# Patient Record
Sex: Male | Born: 1935 | Race: White | Hispanic: No | Marital: Married | Smoking: Former smoker
Health system: Southern US, Community
[De-identification: ages and names within clinical notes are randomized; demographics above are authoritative.]

## PROBLEM LIST (undated history)

## (undated) DIAGNOSIS — E7800 Pure hypercholesterolemia, unspecified: Secondary | ICD-10-CM

## (undated) DIAGNOSIS — K219 Gastro-esophageal reflux disease without esophagitis: Secondary | ICD-10-CM

## (undated) DIAGNOSIS — I10 Essential (primary) hypertension: Secondary | ICD-10-CM

## (undated) DIAGNOSIS — I4891 Unspecified atrial fibrillation: Secondary | ICD-10-CM

## (undated) DIAGNOSIS — Q681 Congenital deformity of finger(s) and hand: Secondary | ICD-10-CM

## (undated) HISTORY — PX: CARDIAC CATHETERIZATION: SHX172

## (undated) HISTORY — PX: HIP ARTHROSCOPY: SHX668

## (undated) HISTORY — PX: CORONARY ANGIOPLASTY WITH STENT PLACEMENT: SHX49

---

## 2012-11-14 ENCOUNTER — Inpatient Hospital Stay: Admit: 2012-11-14 | Payer: Self-pay | Admitting: General Surgery

## 2012-11-14 ENCOUNTER — Encounter (HOSPITAL_COMMUNITY): Payer: Self-pay | Admitting: Anesthesiology

## 2012-11-14 ENCOUNTER — Emergency Department (HOSPITAL_COMMUNITY): Payer: Medicare Other | Admitting: Anesthesiology

## 2012-11-14 ENCOUNTER — Emergency Department (HOSPITAL_COMMUNITY): Payer: Medicare Other

## 2012-11-14 ENCOUNTER — Encounter (HOSPITAL_COMMUNITY): Payer: Self-pay | Admitting: *Deleted

## 2012-11-14 ENCOUNTER — Encounter (HOSPITAL_COMMUNITY)
Admission: EM | Disposition: A | Discharge status: Home / Self Care | Payer: Self-pay | Source: Home / Self Care | Attending: Emergency Medicine

## 2012-11-14 ENCOUNTER — Emergency Department (HOSPITAL_COMMUNITY)
Admission: EM | Admit: 2012-11-14 | Discharge: 2012-11-14 | Disposition: A | Discharge status: Home / Self Care | Payer: Medicare Other | Attending: Emergency Medicine | Admitting: Emergency Medicine

## 2012-11-14 DIAGNOSIS — S66909A Unspecified injury of unspecified muscle, fascia and tendon at wrist and hand level, unspecified hand, initial encounter: Secondary | ICD-10-CM | POA: Insufficient documentation

## 2012-11-14 DIAGNOSIS — W298XXA Contact with other powered powered hand tools and household machinery, initial encounter: Secondary | ICD-10-CM | POA: Insufficient documentation

## 2012-11-14 DIAGNOSIS — I10 Essential (primary) hypertension: Secondary | ICD-10-CM | POA: Insufficient documentation

## 2012-11-14 DIAGNOSIS — S61509A Unspecified open wound of unspecified wrist, initial encounter: Secondary | ICD-10-CM | POA: Insufficient documentation

## 2012-11-14 DIAGNOSIS — E7800 Pure hypercholesterolemia, unspecified: Secondary | ICD-10-CM | POA: Insufficient documentation

## 2012-11-14 DIAGNOSIS — S61511A Laceration without foreign body of right wrist, initial encounter: Secondary | ICD-10-CM

## 2012-11-14 DIAGNOSIS — S5410XA Injury of median nerve at forearm level, unspecified arm, initial encounter: Secondary | ICD-10-CM | POA: Insufficient documentation

## 2012-11-14 HISTORY — DX: Congenital deformity of finger(s) and hand: Q68.1

## 2012-11-14 HISTORY — DX: Pure hypercholesterolemia, unspecified: E78.00

## 2012-11-14 HISTORY — PX: I & D EXTREMITY: SHX5045

## 2012-11-14 HISTORY — DX: Gastro-esophageal reflux disease without esophagitis: K21.9

## 2012-11-14 HISTORY — DX: Essential (primary) hypertension: I10

## 2012-11-14 LAB — BASIC METABOLIC PANEL
BUN: 12 mg/dL (ref 6–23)
Creatinine, Ser: 1.11 mg/dL (ref 0.50–1.35)
GFR calc Af Amer: 71 mL/min — ABNORMAL LOW (ref >90)
GFR calc non Af Amer: 62 mL/min — ABNORMAL LOW (ref >90)

## 2012-11-14 LAB — CBC WITH DIFFERENTIAL/PLATELET
Basophils Absolute: 0.1 10*3/uL (ref 0.0–0.1)
Basophils Relative: 1 % (ref 0–1)
Eosinophils Absolute: 0.6 10*3/uL (ref 0.0–0.7)
Eosinophils Relative: 7 % — ABNORMAL HIGH (ref 0–5)
MCH: 31.4 pg (ref 26.0–34.0)
MCHC: 34.3 g/dL (ref 30.0–36.0)
MCV: 91.4 fL (ref 78.0–100.0)
Platelets: 255 10*3/uL (ref 150–400)
RDW: 13.1 % (ref 11.5–15.5)
WBC: 8.3 10*3/uL (ref 4.0–10.5)

## 2012-11-14 LAB — PROTIME-INR: Prothrombin Time: 18.5 seconds — ABNORMAL HIGH (ref 11.6–15.2)

## 2012-11-14 SURGERY — IRRIGATION AND DEBRIDEMENT EXTREMITY
Anesthesia: General | Site: Arm Lower | Laterality: Right | Wound class: Contaminated

## 2012-11-14 MED ORDER — TETANUS-DIPHTH-ACELL PERTUSSIS 5-2.5-18.5 LF-MCG/0.5 IM SUSP
0.5000 mL | Freq: Once | INTRAMUSCULAR | Status: AC
  Administered 2012-11-14: 0.5 mL via INTRAMUSCULAR
  Filled 2012-11-14 (×2): qty 0.5

## 2012-11-14 MED ORDER — LIDOCAINE-EPINEPHRINE (PF) 2 %-1:200000 IJ SOLN
INTRAMUSCULAR | Status: AC
  Administered 2012-11-14: 20 mL
  Filled 2012-11-14: qty 20

## 2012-11-14 MED ORDER — ONDANSETRON HCL 4 MG/2ML IJ SOLN: 4.0000 mg | Freq: Once | INTRAMUSCULAR | Status: DC | PRN

## 2012-11-14 MED ORDER — PHENYLEPHRINE HCL 10 MG/ML IJ SOLN
INTRAMUSCULAR | Status: DC | PRN
  Administered 2012-11-14: 80 ug via INTRAVENOUS

## 2012-11-14 MED ORDER — BUPIVACAINE HCL (PF) 0.25 % IJ SOLN
INTRAMUSCULAR | Status: DC | PRN
  Administered 2012-11-14: 10 mL

## 2012-11-14 MED ORDER — CEFAZOLIN SODIUM-DEXTROSE 2-3 GM-% IV SOLR
INTRAVENOUS | Status: DC | PRN
  Administered 2012-11-14: 2 g via INTRAVENOUS

## 2012-11-14 MED ORDER — LIDOCAINE-EPINEPHRINE 2 %-1:100000 IJ SOLN
20.0000 mL | Freq: Once | INTRAMUSCULAR | Status: DC
Start: 2012-11-14 — End: 2012-11-15

## 2012-11-14 MED ORDER — PROPOFOL 10 MG/ML IV BOLUS
INTRAVENOUS | Status: DC | PRN
  Administered 2012-11-14: 30 mg via INTRAVENOUS
  Administered 2012-11-14: 150 mg via INTRAVENOUS

## 2012-11-14 MED ORDER — HYDROMORPHONE HCL PF 1 MG/ML IJ SOLN
0.2500 mg | INTRAMUSCULAR | Status: DC | PRN
  Administered 2012-11-14 (×2): 0.5 mg via INTRAVENOUS

## 2012-11-14 MED ORDER — HYDROMORPHONE HCL PF 1 MG/ML IJ SOLN
INTRAMUSCULAR | Status: AC
  Filled 2012-11-14: qty 1

## 2012-11-14 MED ORDER — LIDOCAINE HCL (CARDIAC) 20 MG/ML IV SOLN
INTRAVENOUS | Status: DC | PRN
  Administered 2012-11-14: 100 mg via INTRAVENOUS

## 2012-11-14 MED ORDER — ONDANSETRON HCL 4 MG/2ML IJ SOLN
INTRAMUSCULAR | Status: DC | PRN
  Administered 2012-11-14: 4 mg via INTRAVENOUS

## 2012-11-14 MED ORDER — DEXAMETHASONE SODIUM PHOSPHATE 4 MG/ML IJ SOLN
INTRAMUSCULAR | Status: DC | PRN
  Administered 2012-11-14: 4 mg via INTRAVENOUS

## 2012-11-14 MED ORDER — MIDAZOLAM HCL 5 MG/5ML IJ SOLN
INTRAMUSCULAR | Status: DC | PRN
  Administered 2012-11-14: 2 mg via INTRAVENOUS

## 2012-11-14 MED ORDER — SUFENTANIL CITRATE 50 MCG/ML IV SOLN
INTRAVENOUS | Status: DC | PRN
  Administered 2012-11-14 (×2): 5 ug via INTRAVENOUS

## 2012-11-14 MED ORDER — SODIUM CHLORIDE 0.9 % IV BOLUS (SEPSIS)
1000.0000 mL | Freq: Once | INTRAVENOUS | Status: AC
  Administered 2012-11-14: 1000 mL via INTRAVENOUS

## 2012-11-14 MED ORDER — LACTATED RINGERS IV SOLN
INTRAVENOUS | Status: DC | PRN
  Administered 2012-11-14: 20:00:00 via INTRAVENOUS

## 2012-11-14 MED ORDER — HYDROMORPHONE HCL PF 1 MG/ML IJ SOLN
0.5000 mg | Freq: Once | INTRAMUSCULAR | Status: AC
  Administered 2012-11-14: 0.5 mg via INTRAVENOUS
  Filled 2012-11-14: qty 1

## 2012-11-14 MED ORDER — SODIUM CHLORIDE 0.9 % IR SOLN
Status: DC | PRN
  Administered 2012-11-14: 1

## 2012-11-14 MED ORDER — SODIUM CHLORIDE 0.9 % IV SOLN
INTRAVENOUS | Status: DC | PRN
  Administered 2012-11-14: 19:00:00 via INTRAVENOUS

## 2012-11-14 SURGICAL SUPPLY — 48 items
BAG DECANTER FOR FLEXI CONT (MISCELLANEOUS) IMPLANT
BANDAGE ELASTIC 3 VELCRO ST LF (GAUZE/BANDAGES/DRESSINGS) ×2 IMPLANT
BANDAGE ELASTIC 4 VELCRO ST LF (GAUZE/BANDAGES/DRESSINGS) IMPLANT
BANDAGE GAUZE ELAST BULKY 4 IN (GAUZE/BANDAGES/DRESSINGS) ×2 IMPLANT
BNDG ELASTIC 2 VLCR STRL LF (GAUZE/BANDAGES/DRESSINGS) IMPLANT
CLOTH BEACON ORANGE TIMEOUT ST (SAFETY) ×2 IMPLANT
CORDS BIPOLAR (ELECTRODE) IMPLANT
CUFF TOURNIQUET SINGLE 18IN (TOURNIQUET CUFF) IMPLANT
DRAPE SURG 17X23 STRL (DRAPES) ×2 IMPLANT
ELECT REM PT RETURN 9FT ADLT (ELECTROSURGICAL)
ELECTRODE REM PT RTRN 9FT ADLT (ELECTROSURGICAL) IMPLANT
GAUZE PACKING IODOFORM 1/4X5 (PACKING) IMPLANT
GAUZE XEROFORM 1X8 LF (GAUZE/BANDAGES/DRESSINGS) ×2 IMPLANT
GLOVE BIO SURGEON STRL SZ8 (GLOVE) ×2 IMPLANT
GLOVE BIOGEL PI IND STRL 8 (GLOVE) ×1 IMPLANT
GLOVE BIOGEL PI INDICATOR 8 (GLOVE) ×1
GLOVE ORTHO TXT STRL SZ7.5 (GLOVE) ×2 IMPLANT
GOWN STRL NON-REIN LRG LVL3 (GOWN DISPOSABLE) ×6 IMPLANT
HANDPIECE INTERPULSE COAX TIP (DISPOSABLE)
KIT BASIN OR (CUSTOM PROCEDURE TRAY) ×2 IMPLANT
KIT ROOM TURNOVER OR (KITS) ×2 IMPLANT
MANIFOLD NEPTUNE II (INSTRUMENTS) ×2 IMPLANT
NEEDLE HYPO 25GX1X1/2 BEV (NEEDLE) IMPLANT
NS IRRIG 1000ML POUR BTL (IV SOLUTION) ×2 IMPLANT
NeuraGen Nerve Guide (Nerve Graft) ×2 IMPLANT
PACK ORTHO EXTREMITY (CUSTOM PROCEDURE TRAY) ×2 IMPLANT
PAD ARMBOARD 7.5X6 YLW CONV (MISCELLANEOUS) ×4 IMPLANT
PAD CAST 4YDX4 CTTN HI CHSV (CAST SUPPLIES) ×1 IMPLANT
PADDING CAST COTTON 4X4 STRL (CAST SUPPLIES) ×1
SET HNDPC FAN SPRY TIP SCT (DISPOSABLE) IMPLANT
SOAP 2 % CHG 4 OZ (WOUND CARE) ×2 IMPLANT
SPLINT FIBERGLASS 3X35 (CAST SUPPLIES) ×2 IMPLANT
SPONGE GAUZE 4X4 12PLY (GAUZE/BANDAGES/DRESSINGS) ×2 IMPLANT
SPONGE LAP 18X18 X RAY DECT (DISPOSABLE) IMPLANT
SPONGE LAP 4X18 X RAY DECT (DISPOSABLE) ×2 IMPLANT
SUT FIBERWIRE 4-0 18 DIAM BLUE (SUTURE) ×2
SUT PROLENE 4 0 PS 2 18 (SUTURE) ×4 IMPLANT
SUT PROLENE 5 0 RB 1 DA (SUTURE) ×2 IMPLANT
SUT VIC AB 3-0 SH 27 (SUTURE) ×1
SUT VIC AB 3-0 SH 27X BRD (SUTURE) ×1 IMPLANT
SUTURE FIBERWR 4-0 18 DIA BLUE (SUTURE) ×1 IMPLANT
SYR CONTROL 10ML LL (SYRINGE) IMPLANT
TOWEL OR 17X24 6PK STRL BLUE (TOWEL DISPOSABLE) ×2 IMPLANT
TOWEL OR 17X26 10 PK STRL BLUE (TOWEL DISPOSABLE) ×2 IMPLANT
TUBE ANAEROBIC SPECIMEN COL (MISCELLANEOUS) IMPLANT
TUBE CONNECTING 12X1/4 (SUCTIONS) ×2 IMPLANT
WATER STERILE IRR 1000ML POUR (IV SOLUTION) ×2 IMPLANT
YANKAUER SUCT BULB TIP NO VENT (SUCTIONS) ×2 IMPLANT

## 2012-11-14 NOTE — Op Note (Signed)
NAME:  DONYELL, DING NO.:  [id number]  MEDICAL RECORD NO.:  [id number]  LOCATION:  [facility]                         FACILITY:  [facility]  PHYSICIAN:  Johnette Abraham, MD    DATE OF BIRTH:  10/18/1932  DATE OF PROCEDURE:  11/14/2012 DATE OF DISCHARGE:                              OPERATIVE REPORT   PREOPERATIVE DIAGNOSIS:  Complex laceration, a chainsaw injury to the right wrist with presumed flexor tendon and nerve laceration.  POSTOPERATIVE DIAGNOSIS:  Complex laceration, a chainsaw injury to the right wrist with presumed flexor tendon and nerve laceration.  PROCEDURE:  Exploration of complex wound of the right wrist, evacuation of hematoma, limited fasciotomy of the volar wrist, repair of the FCR tendon and the FCU tendon.  Repair of the median nerve with a 5 mm inside diameter nerve conduit.  Complex closure of laceration totaling 9 cm.  ANESTHESIA:  General.  INDICATIONS:  Mr. Veldhuizen is a 78 year old male, who was working with a chain saw when the chain saw kicked back on him, sustaining a laceration to his right wrist.  He presented to an outside hospital.  I was consulted and referred him here urgently.  On evaluation, he had decreased sensation in the palm.  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  Risks, benefits, and alternatives of surgery were thoroughly discussed with him.  He agreed with these and agreed to proceed with surgery.  Consent was obtained.  PROCEDURE:  The patient was taken to the operating room, placed supine on the operating room table.  Time-out was performed.  General anesthesia was administered without difficulty.  The right upper extremity was prepped and draped in normal sterile fashion.  The arm was exsanguinated.  Tourniquet was inflated to 250 mmHg.  The old temporary stitches from the  outside facility were removed.  There was a gaping wound all the way down to the bone.  There was quite a bit of hematoma that was evacuated.  It was obvious that there were multiple tendon injuries.  Fortunately the radial artery and ulnar artery were intact. The ulnar nerve was isolated and intact.  The FCU tendon was approximately 50% lacerated.  This was repaired with 4-0 FiberWire.  The flexor apparatus to the patient's conjoined ulnar fingers was intact. It was just debrided a little bit, but did not require suture.  There were complete lacerations to some deeper flexor tendons in the wrist, however, when grasping the distal aspect, they gently moved the knob finger that he had and therefore this finger was nonfunctional and therefore they were not repaired.  On the radial wrist, there was complete laceration of the FCR tendon.  There was laceration of the palmaris longus tendon.  The radial artery was intact.  There was complete laceration of the median nerve.  There was laceration of some musculature deep.  The radius was intact.  After thorough irrigation and evacuation of the hematoma, the FCR tendon was brought in proximity.  A epitendinous repair was  performed with a running 5-0 Prolene.  Core strand sutures were repaired with modified Kessler 4-0 FiberWire with a good result.  The palmaris longus tendon was not repaired, was resected gently proximally and distally.  The edges of the median nerve were found after a limited fasciotomy was performed.  Both ends were brought in approximation.  The epineurium was approximated with good wrist flexion with two 6-0 Prolene sutures.  Following a 5 mm inside diameter, nerve conduit was used and wrapped around the nerve repair and sutured in place.  Afterwards the tourniquet was released.  Hemostasis was obtained with direct pressure and cautery.  The muscle fascia was partially closed.  The subcutaneous layer was closed with interrupted  3- 0 Vicryl and the skin was closed with 4-0 Prolene.  A sterile dressing and extension block splint were placed.  The patient tolerated the procedure well and was taken to recovery room in stable condition.     Johnette Abraham, MD     HCC/MEDQ  D:  11/14/2012  T:  11/14/2012  Job:  [id number]

## 2012-11-14 NOTE — ED Notes (Signed)
Cut right wrist with chain saw PTA.  Bleeding actively in triage.

## 2012-11-14 NOTE — Anesthesia Postprocedure Evaluation (Signed)
  Anesthesia Post-op Note  Patient: Anthony Fields  Procedure(s) Performed: Procedure(s) (LRB) with comments: IRRIGATION AND DEBRIDEMENT EXTREMITY (Right)  Patient Location: PACU  Anesthesia Type:General  Level of Consciousness: awake, oriented, sedated and patient cooperative  Airway and Oxygen Therapy: Patient Spontanous Breathing  Post-op Pain: none  Post-op Assessment: Post-op Vital signs reviewed, Patient's Cardiovascular Status Stable, Respiratory Function Stable, Patent Airway, No signs of Nausea or vomiting and Pain level controlled  Post-op Vital Signs: stable  Complications: No apparent anesthesia complications

## 2012-11-14 NOTE — Transfer of Care (Signed)
Immediate Anesthesia Transfer of Care Note  Patient: Anthony Fields  Procedure(s) Performed: Procedure(s) (LRB) with comments: IRRIGATION AND DEBRIDEMENT EXTREMITY (Right)  Patient Location: PACU  Anesthesia Type:General  Level of Consciousness: awake, alert , oriented and patient cooperative  Airway & Oxygen Therapy: Patient Spontanous Breathing and Patient connected to nasal cannula oxygen  Post-op Assessment: Report given to PACU RN, Post -op Vital signs reviewed and stable and Patient moving all extremities X 4  Post vital signs: Reviewed and stable  Complications: No apparent anesthesia complications

## 2012-11-14 NOTE — H&P (Signed)
Reason for Consult:laceration Referring Physician: ER - [facility]  Anthony Fields is an 78 y.o. left handed male.  CC: I cut my wrist HPI: pt wass using chain saw and saw kicked and lacerated wrist, c/o bleeding, numbness to thumb; presented to outside ER, active bleeding controlled with sutures, tight wrap. Currently pain 4/10, dull, throbbing, isolated to R wrist.   Past Medical History  Diagnosis Date  . Hypertension   . High cholesterol   . Acid reflux   . Hand deformity, congenital     right hand -fingers  CAD H/O Mini Stroke  Past Surgical History  Procedure Date  . Cardiac catheterization   . Coronary angioplasty with stent placement     No family history on file.  Social History:  does not have a smoking history on file. He does not have any smokeless tobacco history on file. His alcohol and drug histories not on file. Quit smoking years ago, occasional beer Allergies: No Known Allergies  Medications: I have reviewed the patient's current medications.  Results for orders placed during the hospital encounter of 11/14/12 (from the past 48 hour(s))  CBC WITH DIFFERENTIAL     Status: Abnormal   Collection Time   11/14/12  4:00 PM      Component Value Range Comment   WBC 8.3  4.0 - 10.5 K/uL    RBC 4.43  4.22 - 5.81 MIL/uL    Hemoglobin 13.9  13.0 - 17.0 g/dL    HCT 16.1  09.6 - 04.5 %    MCV 91.4  78.0 - 100.0 fL    MCH 31.4  26.0 - 34.0 pg    MCHC 34.3  30.0 - 36.0 g/dL    RDW 40.9  81.1 - 91.4 %    Platelets 255  150 - 400 K/uL    Neutrophils Relative 46  43 - 77 %    Neutro Abs 3.8  1.7 - 7.7 K/uL    Lymphocytes Relative 36  12 - 46 %    Lymphs Abs 3.0  0.7 - 4.0 K/uL    Monocytes Relative 10  3 - 12 %    Monocytes Absolute 0.8  0.1 - 1.0 K/uL    Eosinophils Relative 7 (*) 0 - 5 %    Eosinophils Absolute 0.6  0.0 - 0.7 K/uL    Basophils Relative 1  0 - 1 %    Basophils Absolute 0.1  0.0 - 0.1 K/uL   PROTIME-INR     Status: Abnormal   Collection Time   11/14/12   4:00 PM      Component Value Range Comment   Prothrombin Time 18.5 (*) 11.6 - 15.2 seconds    INR 1.59 (*) 0.00 - 1.49   BASIC METABOLIC PANEL     Status: Abnormal   Collection Time   11/14/12  4:00 PM      Component Value Range Comment   Sodium 141  135 - 145 mEq/L    Potassium 3.6  3.5 - 5.1 mEq/L    Chloride 106  96 - 112 mEq/L    CO2 25  19 - 32 mEq/L    Glucose, Bld 146 (*) 70 - 99 mg/dL    BUN 12  6 - 23 mg/dL    Creatinine, Ser 7.82  0.50 - 1.35 mg/dL    Calcium 9.7  8.4 - 95.6 mg/dL    GFR calc non Af Amer 62 (*) >90 mL/min    GFR calc Af Amer 71 (*) >  90 mL/min   TYPE AND SCREEN     Status: Normal   Collection Time   11/14/12  4:00 PM      Component Value Range Comment   ABO/RH(D) A POS      Antibody Screen NEG      Sample Expiration 11/17/2012       Dg Wrist 2 Views Right  11/14/2012  *RADIOLOGY REPORT*  Clinical Data: Chainsaw laceration  RIGHT WRIST - 2 VIEW  Comparison: None.  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  Mild degenerative change at the base of the thumb.  No acute fracture.  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  Soft tissue swelling ventral wrist region.  IMPRESSION: Negative for acute fracture.   Original Report Authenticated By: Janeece Riggers, M.D.    Dg Chest Portable 1 View  11/14/2012  *RADIOLOGY REPORT*  Clinical Data: Extremity laceration  PORTABLE CHEST - 1 VIEW  Comparison: None  Findings: Heart size and vascularity are normal.  Lungs are clear without infiltrate or effusion.  Negative for pneumonia. Mild apical scarring.  IMPRESSION: No acute cardiopulmonary abnormality.   Original Report Authenticated By: Janeece Riggers, M.D.     Pertinent items are noted in HPI.  No recent sob, cp, s/s of stroke Temp:  [97.8 F (36.6 C)-98.5 F (36.9 C)] 98.5 F (36.9 C) (12/21 1734) Pulse Rate:  [76-114] 76  (12/21 1734) Resp:  [16-20] 16  (12/21 1734) BP: (134-140)/(64-66) 134/66 mmHg (12/21  1734) SpO2:  [95 %-96 %] 95 % (12/21 1734) General appearance: alert and cooperative Resp: clear to auscultation bilaterally Cardio: regular rate and rhythm GI: soft, non-tender; bowel sounds normal; no masses,  no organomegaly Extremities: extremities normal, atraumatic, no cyanosis or edema and except for R wrist with loosely sutured laceration entire anterior wrist; no active bleeding; decreased sensation to thumb and palm of hand, ulnar side of hand appears wnl, congenital deformity of hand (claw) with good strength of thumb, good flexion of ulnar digits, cap refill intact    Assessment/Plan: Chain saw injury to R wrist - ? Tendon, nerve injury Plan: will explore and repair - explained procedure to patient in detail including risks of surgery.  Anthony Fields CHRISTOPHER 11/14/2012, 6:28 PM

## 2012-11-14 NOTE — Anesthesia Preprocedure Evaluation (Signed)
Anesthesia Evaluation  Patient identified by MRN, date of birth, ID band Patient awake    Reviewed: Allergy & Precautions, H&P , NPO status , Patient's Chart, lab work & pertinent test results  Airway Mallampati: I TM Distance: >3 FB Neck ROM: full    Dental   Pulmonary          Cardiovascular hypertension, + CAD and + Cardiac Stents Rhythm:regular Rate:Normal     Neuro/Psych    GI/Hepatic GERD-  ,  Endo/Other    Renal/GU      Musculoskeletal   Abdominal   Peds  Hematology   Anesthesia Other Findings   Reproductive/Obstetrics                           Anesthesia Physical Anesthesia Plan  ASA: III  Anesthesia Plan: General   Post-op Pain Management:    Induction: Intravenous  Airway Management Planned: LMA and Oral ETT  Additional Equipment:   Intra-op Plan:   Post-operative Plan: Extubation in OR  Informed Consent:   Plan Discussed with:   Anesthesia Plan Comments:         Anesthesia Quick Evaluation

## 2012-11-14 NOTE — Discharge Instructions (Signed)
Stop taking your Aspirin and Plavix until Tuesday, Dec 24th. Then you may resume taking.  Discharge Instructions:  Keep your dressing clean, dry and in place until instructed to remove by Dr. Izora Ribas.  If the dressing becomes dirty or wet call the office for instructions during business hours. Elevate the extremity to help with swelling, this will also help with any discomfort. Take your medication as prescribed. No lifting with the injured  extremity. If you feel that the dressing is too tight, you may loosen it, but keep it on; finger tips should be pink; if there is a concern, call the office. [phone] Ice may be used if the injury is a fracture, do not apply ice directly to the skin. Please call the office on the next business day after discharge to arrange a follow up appointment.  Call [phone] between the hours of 9am - 5pm M-Th or 9am - 1pm on Fri. For most hand injuries and/or conditions, you may return to work using the uninjured hand (one handed duty) within 24-72 hours.  A detailed note will be provided to you at your follow up appointment or may contact the office prior to your follow up.   Laceration Care, Adult A laceration is a cut or lesion that goes through all layers of the skin and into the tissue just beneath the skin. TREATMENT  Some lacerations may not require closure. Some lacerations may not be able to be closed due to an increased risk of infection. It is important to see your caregiver as soon as possible after an injury to minimize the risk of infection and maximize the opportunity for successful closure. If closure is appropriate, pain medicines may be given, if needed. The wound will be cleaned to help prevent infection. Your caregiver will use stitches (sutures), staples, wound glue (adhesive), or skin adhesive strips to repair the laceration. These tools bring the skin edges together to allow for faster healing and a better cosmetic outcome. However, all  wounds will heal with a scar. Once the wound has healed, scarring can be minimized by covering the wound with sunscreen during the day for 1 full year. HOME CARE INSTRUCTIONS  For sutures or staples:  Keep the wound clean and dry.  If you were given a bandage (dressing), you should change it at least once a day. Also, change the dressing if it becomes wet or dirty, or as directed by your caregiver.  Wash the wound with soap and water 2 times a day. Rinse the wound off with water to remove all soap. Pat the wound dry with a clean towel.  After cleaning, apply a thin layer of the antibiotic ointment as recommended by your caregiver. This will help prevent infection and keep the dressing from sticking.  You may shower as usual after the first 24 hours. Do not soak the wound in water until the sutures are removed.  Only take over-the-counter or prescription medicines for pain, discomfort, or fever as directed by your caregiver.  Get your sutures or staples removed as directed by your caregiver. For skin adhesive strips:  Keep the wound clean and dry.  Do not get the skin adhesive strips wet. You may bathe carefully, using caution to keep the wound dry.  If the wound gets wet, pat it dry with a clean towel.  Skin adhesive strips will fall off on their own. You may trim the strips as the wound heals. Do not remove skin adhesive strips that are still  stuck to the wound. They will fall off in time. For wound adhesive:  You may briefly wet your wound in the shower or bath. Do not soak or scrub the wound. Do not swim. Avoid periods of heavy perspiration until the skin adhesive has fallen off on its own. After showering or bathing, gently pat the wound dry with a clean towel.  Do not apply liquid medicine, cream medicine, or ointment medicine to your wound while the skin adhesive is in place. This may loosen the film before your wound is healed.  If a dressing is placed over the wound, be  careful not to apply tape directly over the skin adhesive. This may cause the adhesive to be pulled off before the wound is healed.  Avoid prolonged exposure to sunlight or tanning lamps while the skin adhesive is in place. Exposure to ultraviolet light in the first year will darken the scar.  The skin adhesive will usually remain in place for 5 to 10 days, then naturally fall off the skin. Do not pick at the adhesive film. You may need a tetanus shot if:  You cannot remember when you had your last tetanus shot.  You have never had a tetanus shot. If you get a tetanus shot, your arm may swell, get red, and feel warm to the touch. This is common and not a problem. If you need a tetanus shot and you choose not to have one, there is a rare chance of getting tetanus. Sickness from tetanus can be serious. SEEK MEDICAL CARE IF:   You have redness, swelling, or increasing pain in the wound.  You see a red line that goes away from the wound.  You have yellowish-white fluid (pus) coming from the wound.  You have a fever.  You notice a bad smell coming from the wound or dressing.  Your wound breaks open before or after sutures have been removed.  You notice something coming out of the wound such as wood or glass.  Your wound is on your hand or foot and you cannot move a finger or toe. SEEK IMMEDIATE MEDICAL CARE IF:   Your pain is not controlled with prescribed medicine.  You have severe swelling around the wound causing pain and numbness or a change in color in your arm, hand, leg, or foot.  Your wound splits open and starts bleeding.  You have worsening numbness, weakness, or loss of function of any joint around or beyond the wound.  You develop painful lumps near the wound or on the skin anywhere on your body. MAKE SURE YOU:   Understand these instructions.  Will watch your condition.  Will get help right away if you are not doing well or get worse. Document Released:  11/11/2005 Document Revised: 02/03/2012 Document Reviewed: 05/07/2011 [facility] Patient Information 2013 [place], [place].

## 2012-11-14 NOTE — ED Provider Notes (Signed)
History   This chart was scribed for Glynn Octave, MD by Leone Payor, ED Scribe. This patient was seen in room APA02/APA02 and the patient's care was started at 1519.   CSN: [id number]  Arrival date & time 11/14/12  1519   None     Chief Complaint  Patient presents with  . Extremity Laceration     The history is provided by the patient. No language interpreter was used.    Anthony Fields is a 78 y.o. male who presents to the Emergency Department complaining of a new, severe laceration to the right wrist from a chainsaw accident PTA. Pt states he has some numbness to the fingers but is able to move them. Pt states he takes aspirin daily. The bleeding is active.     Dr. Jerrol Banana is heart doctor.  Pt has h/o HTN, high cholesterol, acid reflux, cardiac catheterization.  History reviewed. No pertinent past medical history.  No past surgical history on file.  No family history on file.  History  Substance Use Topics  . Smoking status: Not on file  . Smokeless tobacco: Not on file  . Alcohol Use: Not on file      Review of Systems  A complete 10 system review of systems was obtained and all systems are negative except as noted in the HPI and PMH.   Allergies  Review of patient's allergies indicates not on file.  Home Medications  No current outpatient prescriptions on file.  There were no vitals taken for this visit.  Physical Exam  Nursing note and vitals reviewed. Constitutional: He appears well-developed and well-nourished.  HENT:  Head: Normocephalic and atraumatic.  Eyes: Conjunctivae normal are normal. Pupils are equal, round, and reactive to light.  Neck: Neck supple. No tracheal deviation present. No thyromegaly present.  Cardiovascular: Normal rate and regular rhythm.   No murmur heard.      2+ radial pulse. No arterial bleeding  Pulmonary/Chest: Effort normal and breath sounds normal.  Abdominal: Soft. Bowel sounds are normal. He exhibits no distension.  There is no tenderness.  Musculoskeletal: Normal range of motion. He exhibits no edema and no tenderness.       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 5cm gaping laceration to the palmer aspect of the right wrist. There is active oozing to the medial and lateral aspect of the wound.   Neurological: He is alert. Coordination normal.  Skin: Skin is warm and dry. No rash noted.  Psychiatric: He has a normal mood and affect.    ED Course  LACERATION REPAIR Date/Time: 11/14/2012 4:00 PM Performed by: Glynn Octave Authorized by: Glynn Octave Consent: Verbal consent obtained. The procedure was performed in an emergent situation. Risks and benefits: risks, benefits and alternatives were discussed Consent given by: patient Patient identity confirmed: verbally with patient and arm band Time out: Immediately prior to procedure a "time out" was called to verify the correct patient, procedure, equipment, support staff and site/side marked as required. Body area: upper extremity Location details: right wrist Laceration length: 6 cm Contamination: The wound is contaminated. Tendon involvement: complex Nerve involvement: complex Vascular damage: yes Anesthesia: local infiltration Local anesthetic: lidocaine 2% with epinephrine Anesthetic total: 10 ml Patient sedated: no Preparation: Patient was prepped and draped in the usual sterile fashion. Irrigation solution: saline Irrigation method: syringe Amount of cleaning: extensive Debridement: none Degree of undermining: none Skin closure: 3-0 nylon Technique: simple Approximation: close Approximation difficulty: complex Patient tolerance: Patient tolerated the procedure well  with no immediate complications. Comments: Temporary hemostasis of oozing blood vessels   (including critical care time)  DIAGNOSTIC STUDIES: Oxygen Saturation is 95% on room air, adequate by my interpretation.    COORDINATION OF  CARE:  3:34 PM Discussed treatment plan which includes imaging  with pt at bedside and pt agreed to plan.    Labs Reviewed  CBC WITH DIFFERENTIAL - Abnormal; Notable for the following:    Eosinophils Relative 7 (*)     All other components within normal limits  PROTIME-INR - Abnormal; Notable for the following:    Prothrombin Time 18.5 (*)     INR 1.59 (*)     All other components within normal limits  BASIC METABOLIC PANEL - Abnormal; Notable for the following:    Glucose, Bld 146 (*)     GFR calc non Af Amer 62 (*)     GFR calc Af Amer 71 (*)     All other components within normal limits  TYPE AND SCREEN   Dg Wrist 2 Views Right  11/14/2012  *RADIOLOGY REPORT*  Clinical Data: Chainsaw laceration  RIGHT WRIST - 2 VIEW  Comparison: None.  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  Mild degenerative change at the base of the thumb.  No acute fracture.  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  Soft tissue swelling ventral wrist region.  IMPRESSION: Negative for acute fracture.   Original Report Authenticated By: Janeece Riggers, M.D.    Dg Chest Portable 1 View  11/14/2012  *RADIOLOGY REPORT*  Clinical Data: Extremity laceration  PORTABLE CHEST - 1 VIEW  Comparison: None  Findings: Heart size and vascularity are normal.  Lungs are clear without infiltrate or effusion.  Negative for pneumonia. Mild apical scarring.  IMPRESSION: No acute cardiopulmonary abnormality.   Original Report Authenticated By: Janeece Riggers, M.D.      No diagnosis found.    MDM  Laceration to right volar wrist with chainsaw. Active bleeding with flexor tendons visible. 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  Sensation is subjectively decreased.  Large sutures placed in skin temporarily for hemostasis. Bleeding controlled with pressure dressing and temporary sutures.  Discussed with hand surgery Dr. Izora Ribas who will meet patient at  [facility] ED.  Dr. Ignacia Palma accepts patient for [place]. Vitals remain stable and bleeding controlled at time of transfer.   Date: 11/14/2012  Rate: 73  Rhythm: normal sinus rhythm  QRS Axis: normal  Intervals: normal  ST/T Wave abnormalities: normal  Conduction Disutrbances:none  Narrative Interpretation:   Old EKG Reviewed: none available    CRITICAL CARE Performed by: Glynn Octave   Total critical care time: 30  Critical care time was exclusive of separately billable procedures and treating other patients.  Critical care was necessary to treat or prevent imminent or life-threatening deterioration.  Critical care was time spent personally by me on the following activities: development of treatment plan with patient and/or surrogate as well as nursing, discussions with consultants, evaluation of patient's response to treatment, examination of patient, obtaining history from patient or surrogate, ordering and performing treatments and interventions, ordering and review of laboratory studies, ordering and review of radiographic studies, pulse oximetry and re-evaluation of patient's condition.    I personally performed the services described in this documentation, which was scribed in my presence. The recorded information has been reviewed and is accurate.    Glynn Octave, MD 11/14/12 [phone]

## 2012-11-16 ENCOUNTER — Encounter (HOSPITAL_COMMUNITY): Payer: Self-pay | Admitting: General Surgery

## 2014-03-29 IMAGING — CR DG WRIST 2V*R*
2 series · 2 of 2 positions shown · non-contrast
Comparison: None.

CLINICAL DATA: Chainsaw laceration

RIGHT WRIST - 2 VIEW

[view not recorded (1 of 2)]
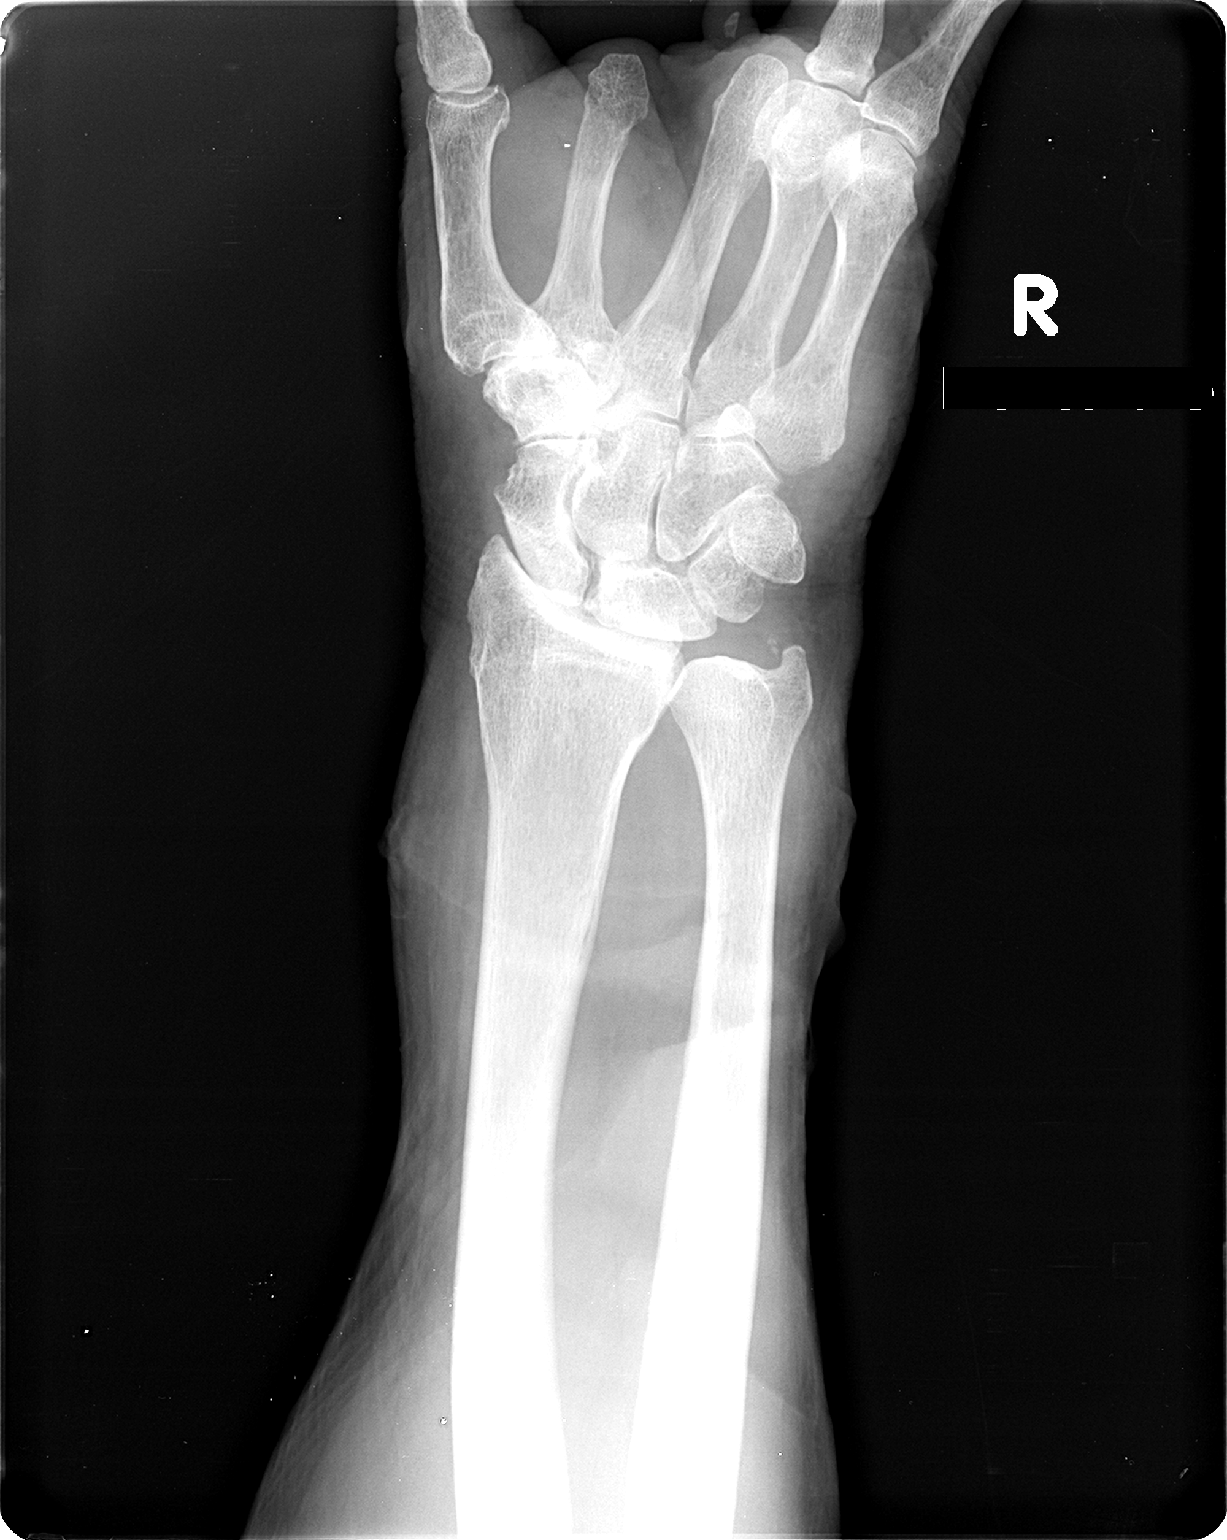

[view not recorded (2 of 2)]
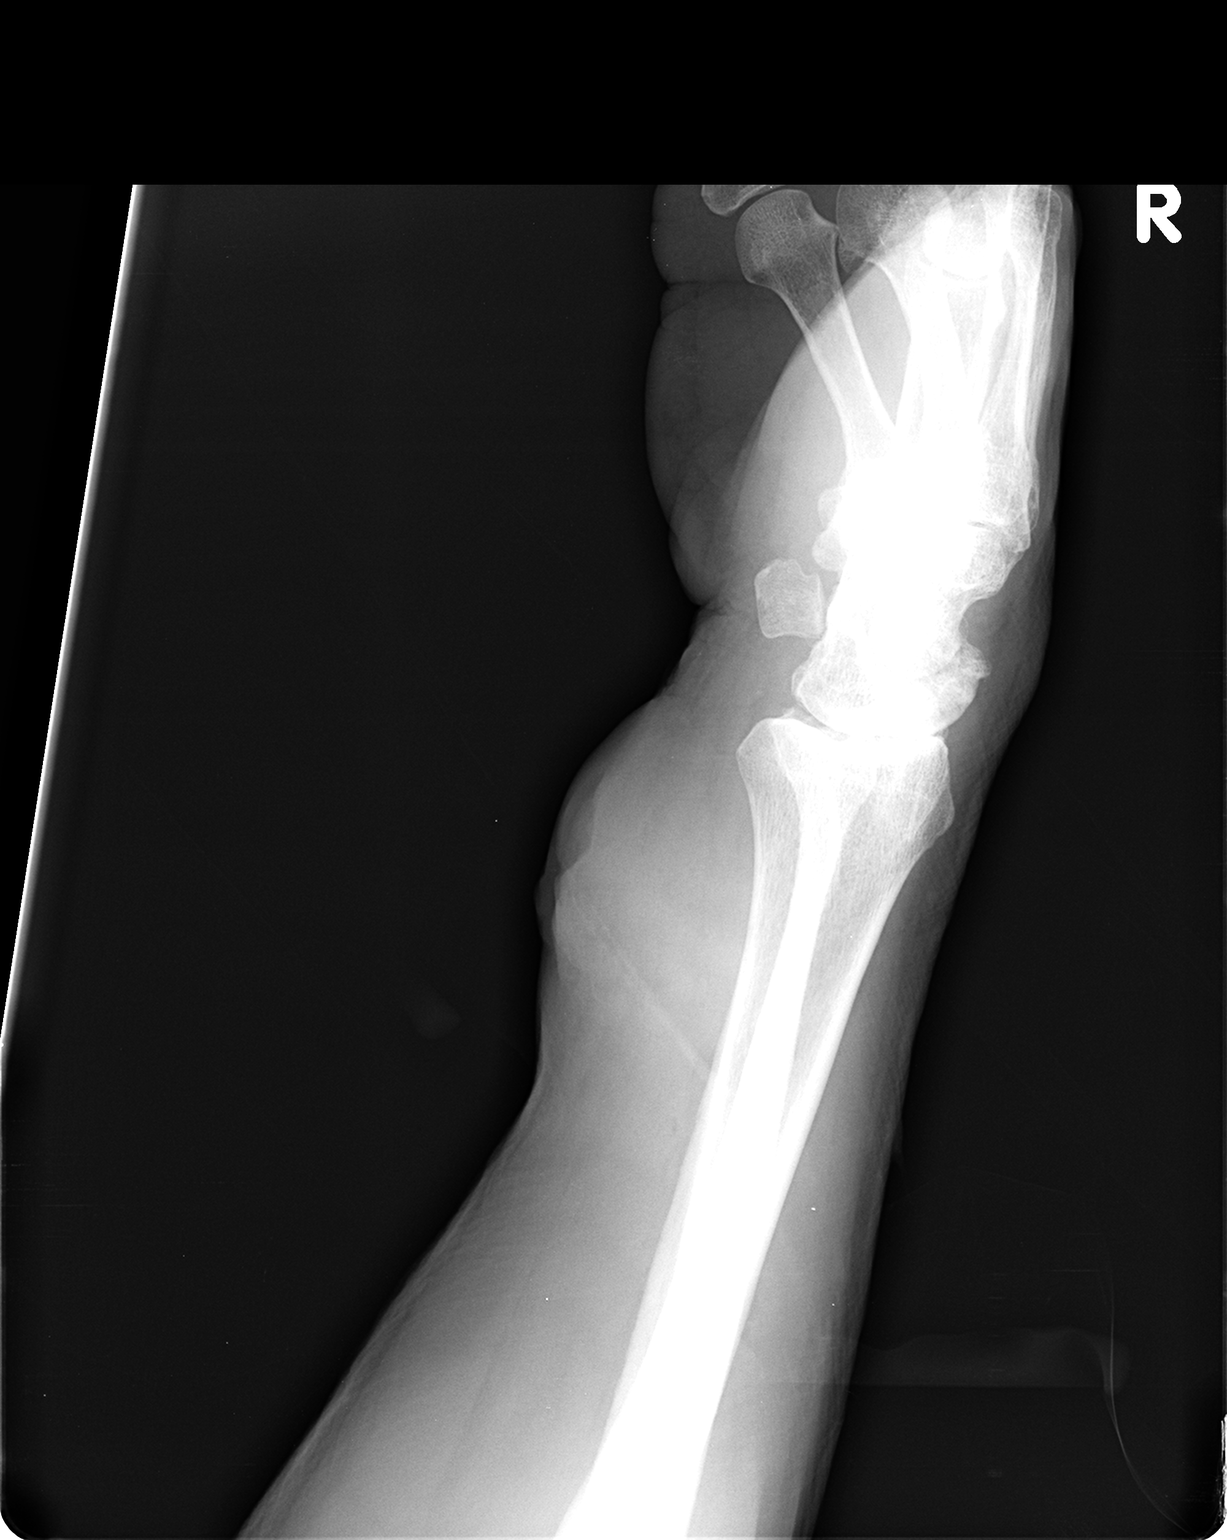

[2 of 2 positions shown; findings below may reference images not displayed]

FINDINGS: Osteoarthritis in the radiocarpal joint.  Mild
degenerative change at the base of the thumb.

No acute fracture.

Prior amputation of the second and third digits at the metacarpal
phalangeal joint.

Soft tissue swelling ventral wrist region.
IMPRESSION: Negative for acute fracture.

## 2020-01-28 ENCOUNTER — Emergency Department (HOSPITAL_COMMUNITY)
Admission: EM | Admit: 2020-01-28 | Discharge: 2020-01-28 | Disposition: A | Discharge status: Home / Self Care | Payer: Medicare Other | Attending: Emergency Medicine | Admitting: Emergency Medicine

## 2020-01-28 ENCOUNTER — Emergency Department (HOSPITAL_COMMUNITY): Payer: Medicare Other

## 2020-01-28 ENCOUNTER — Other Ambulatory Visit: Payer: Self-pay

## 2020-01-28 ENCOUNTER — Encounter (HOSPITAL_COMMUNITY): Payer: Self-pay | Admitting: *Deleted

## 2020-01-28 DIAGNOSIS — Z79899 Other long term (current) drug therapy: Secondary | ICD-10-CM | POA: Diagnosis not present

## 2020-01-28 DIAGNOSIS — R05 Cough: Secondary | ICD-10-CM | POA: Insufficient documentation

## 2020-01-28 DIAGNOSIS — I10 Essential (primary) hypertension: Secondary | ICD-10-CM | POA: Insufficient documentation

## 2020-01-28 DIAGNOSIS — R0602 Shortness of breath: Secondary | ICD-10-CM | POA: Diagnosis present

## 2020-01-28 DIAGNOSIS — U071 COVID-19: Secondary | ICD-10-CM | POA: Diagnosis not present

## 2020-01-28 DIAGNOSIS — R531 Weakness: Secondary | ICD-10-CM | POA: Insufficient documentation

## 2020-01-28 LAB — BASIC METABOLIC PANEL
Anion gap: 14 (ref 5–15)
BUN: 27 mg/dL — ABNORMAL HIGH (ref 8–23)
CO2: 22 mmol/L (ref 22–32)
Calcium: 9.1 mg/dL (ref 8.9–10.3)
Chloride: 104 mmol/L (ref 98–111)
Creatinine, Ser: 1.27 mg/dL — ABNORMAL HIGH (ref 0.61–1.24)
GFR calc Af Amer: 59 mL/min — ABNORMAL LOW (ref >60)
GFR calc non Af Amer: 51 mL/min — ABNORMAL LOW (ref >60)
Glucose, Bld: 111 mg/dL — ABNORMAL HIGH (ref 70–99)
Potassium: 4.6 mmol/L (ref 3.5–5.1)
Sodium: 140 mmol/L (ref 135–145)

## 2020-01-28 LAB — TROPONIN I (HIGH SENSITIVITY)
Troponin I (High Sensitivity): 21 ng/L — ABNORMAL HIGH (ref <18)
Troponin I (High Sensitivity): 22 ng/L — ABNORMAL HIGH (ref <18)

## 2020-01-28 LAB — CBC
HCT: 42.5 % (ref 39.0–52.0)
Hemoglobin: 13.5 g/dL (ref 13.0–17.0)
MCH: 28.8 pg (ref 26.0–34.0)
MCHC: 31.8 g/dL (ref 30.0–36.0)
MCV: 90.6 fL (ref 80.0–100.0)
Platelets: 329 10*3/uL (ref 150–400)
RBC: 4.69 MIL/uL (ref 4.22–5.81)
RDW: 13.3 % (ref 11.5–15.5)
WBC: 6.2 10*3/uL (ref 4.0–10.5)
nRBC: 0 % (ref 0.0–0.2)

## 2020-01-28 LAB — POC SARS CORONAVIRUS 2 AG -  ED: SARS Coronavirus 2 Ag: POSITIVE — AB

## 2020-01-28 MED ORDER — SODIUM CHLORIDE 0.9% FLUSH: 3.0000 mL | Freq: Once | INTRAVENOUS | Status: DC

## 2020-01-28 NOTE — ED Notes (Signed)
Pt is waiting for his daughter to come get him for D/C at this time.

## 2020-01-28 NOTE — Discharge Instructions (Addendum)
Person Under Monitoring Name: Anthony Fields  Location: [address] [place] [address]   Infection Prevention Recommendations for Individuals Confirmed to have, or Being Evaluated for, 2019 Novel Coronavirus (COVID-19) Infection Who Receive Care at Home  Individuals who are confirmed to have, or are being evaluated for, COVID-19 should follow the prevention steps below until a healthcare provider or local or state health department says they can return to normal activities.  Stay home except to get medical care You should restrict activities outside your home, except for getting medical care. Do not go to work, school, or public areas, and do not use public transportation or taxis.  Call ahead before visiting your doctor Before your medical appointment, call the healthcare provider and tell them that you have, or are being evaluated for, COVID-19 infection. This will help the healthcare provider's office take steps to keep other people from getting infected. Ask your healthcare provider to call the local or state health department.  Monitor your symptoms Seek prompt medical attention if your illness is worsening (e.g., difficulty breathing). Before going to your medical appointment, call the healthcare provider and tell them that you have, or are being evaluated for, COVID-19 infection. Ask your healthcare provider to call the local or state health department.  Wear a facemask You should wear a facemask that covers your nose and mouth when you are in the same room with other people and when you visit a healthcare provider. People who live with or visit you should also wear a facemask while they are in the same room with you.  Separate yourself from other people in your home As much as possible, you should stay in a different room from other people in your home. Also, you should use a separate bathroom, if available.  Avoid sharing household items You should not share  dishes, drinking glasses, cups, eating utensils, towels, bedding, or other items with other people in your home. After using these items, you should wash them thoroughly with soap and water.  Cover your coughs and sneezes Cover your mouth and nose with a tissue when you cough or sneeze, or you can cough or sneeze into your sleeve. Throw used tissues in a lined trash can, and immediately wash your hands with soap and water for at least 20 seconds or use an alcohol-based hand rub.  Wash your [employer] your hands often and thoroughly with soap and water for at least 20 seconds. You can use an alcohol-based hand sanitizer if soap and water are not available and if your hands are not visibly dirty. Avoid touching your eyes, nose, and mouth with unwashed hands.   Prevention Steps for Caregivers and Household Members of Individuals Confirmed to have, or Being Evaluated for, COVID-19 Infection Being Cared for in the Home  If you live with, or provide care at home for, a person confirmed to have, or being evaluated for, COVID-19 infection please follow these guidelines to prevent infection:  Follow healthcare provider's instructions Make sure that you understand and can help the patient follow any healthcare provider instructions for all care.  Provide for the patient's basic needs You should help the patient with basic needs in the home and provide support for getting groceries, prescriptions, and other personal needs.  Monitor the patient's symptoms If they are getting sicker, call his or her medical provider and tell them that the patient has, or is being evaluated for, COVID-19 infection. This will help the healthcare provider's office take  steps to keep other people from getting infected. Ask the healthcare provider to call the local or state health department.  Limit the number of people who have contact with the patient If possible, have only one caregiver for the patient. Other  household members should stay in another home or place of residence. If this is not possible, they should stay in another room, or be separated from the patient as much as possible. Use a separate bathroom, if available. Restrict visitors who do not have an essential need to be in the home.  Keep older adults, very young children, and other sick people away from the patient Keep older adults, very young children, and those who have compromised immune systems or chronic health conditions away from the patient. This includes people with chronic heart, lung, or kidney conditions, diabetes, and cancer.  Ensure good ventilation Make sure that shared spaces in the home have good air flow, such as from an air conditioner or an opened window, weather permitting.  Wash your hands often Wash your hands often and thoroughly with soap and water for at least 20 seconds. You can use an alcohol based hand sanitizer if soap and water are not available and if your hands are not visibly dirty. Avoid touching your eyes, nose, and mouth with unwashed hands. Use disposable paper towels to dry your hands. If not available, use dedicated cloth towels and replace them when they become wet.  Wear a facemask and gloves Wear a disposable facemask at all times in the room and gloves when you touch or have contact with the patient's blood, body fluids, and/or secretions or excretions, such as sweat, saliva, sputum, nasal mucus, vomit, urine, or feces.  Ensure the mask fits over your nose and mouth tightly, and do not touch it during use. Throw out disposable facemasks and gloves after using them. Do not reuse. Wash your hands immediately after removing your facemask and gloves. If your personal clothing becomes contaminated, carefully remove clothing and launder. Wash your hands after handling contaminated clothing. Place all used disposable facemasks, gloves, and other waste in a lined container before disposing them with  other household waste. Remove gloves and wash your hands immediately after handling these items.  Do not share dishes, glasses, or other household items with the patient Avoid sharing household items. You should not share dishes, drinking glasses, cups, eating utensils, towels, bedding, or other items with a patient who is confirmed to have, or being evaluated for, COVID-19 infection. After the person uses these items, you should wash them thoroughly with soap and water.  Wash laundry thoroughly Immediately remove and wash clothes or bedding that have blood, body fluids, and/or secretions or excretions, such as sweat, saliva, sputum, nasal mucus, vomit, urine, or feces, on them. Wear gloves when handling laundry from the patient. Read and follow directions on labels of laundry or clothing items and detergent. In general, wash and dry with the warmest temperatures recommended on the label.  Clean all areas the individual has used often Clean all touchable surfaces, such as counters, tabletops, doorknobs, bathroom fixtures, toilets, phones, keyboards, tablets, and bedside tables, every day. Also, clean any surfaces that may have blood, body fluids, and/or secretions or excretions on them. Wear gloves when cleaning surfaces the patient has come in contact with. Use a diluted bleach solution (e.g., dilute bleach with 1 part bleach and 10 parts water) or a household disinfectant with a label that says EPA-registered for coronaviruses. To make a bleach solution  at home, add 1 tablespoon of bleach to 1 quart (4 cups) of water. For a larger supply, add  cup of bleach to 1 gallon (16 cups) of water. Read labels of cleaning products and follow recommendations provided on product labels. Labels contain instructions for safe and effective use of the cleaning product including precautions you should take when applying the product, such as wearing gloves or eye protection and making sure you have good ventilation  during use of the product. Remove gloves and wash hands immediately after cleaning.  Monitor yourself for signs and symptoms of illness Caregivers and household members are considered close contacts, should monitor their health, and will be asked to limit movement outside of the home to the extent possible. Follow the monitoring steps for close contacts listed on the symptom monitoring form.   ? If you have additional questions, contact your local health department or call the epidemiologist on call at [phone] (available 24/7). ? This guidance is subject to change. For the most up-to-date guidance from [facility], please refer to their website: [email or web address]

## 2020-01-28 NOTE — ED Triage Notes (Signed)
Pt reports ongoing cardiac problems and no relief after meds changed by pcp. Denies having chest pain. Reports ongoing generalized fatigue, weakness and sob. No acute distress is noted at triage.

## 2020-01-28 NOTE — ED Provider Notes (Addendum)
[facility] EMERGENCY DEPARTMENT Provider Note   CSN: [id number] Arrival date & time: 01/28/20  1021     History Chief Complaint  Patient presents with  . Weakness  . Shortness of Breath    Anthony Fields is a 86 y.o. male.  86 year old male presents with 2 weeks of decreased appetite and weakness.  He denies any fever or chills.  Has had slight increased cough without congestion.  No loss of taste or smell.  No chest pain or abdominal discomfort.  Denies any new medications.  Saw her physician recently and had his medications changed.  He is here because he is frustrated no one can seem to help him.        Past Medical History:  Diagnosis Date  . Acid reflux   . Hand deformity, congenital    right hand -fingers  . High cholesterol   . Hypertension     There are no problems to display for this patient.   Past Surgical History:  Procedure Laterality Date  . CARDIAC CATHETERIZATION    . CORONARY ANGIOPLASTY WITH STENT PLACEMENT    . I & D EXTREMITY  11/14/2012   Procedure: IRRIGATION AND DEBRIDEMENT EXTREMITY;  Surgeon: Johnette Abraham, MD;  Location: [facility] OR;  Service: Plastics;  Laterality: Right;       History reviewed. No pertinent family history.  Social History   Tobacco Use  . Smoking status: Former Smoker  Substance Use Topics  . Alcohol use: Not on file  . Drug use: Not on file    Home Medications Prior to Admission medications   Medication Sig Start Date End Date Taking? Authorizing Provider  amLODipine (NORVASC) 5 MG tablet Take 5 mg by mouth daily.    [provider]  atorvastatin (LIPITOR) 20 MG tablet Take 20 mg by mouth daily.    [provider]  fenofibrate (TRICOR) 145 MG tablet Take 145 mg by mouth daily.    [provider]  losartan (COZAAR) 100 MG tablet Take 100 mg by mouth daily.    [provider]  niacin (NIASPAN) 1000 MG CR tablet Take 1,000 mg by mouth at bedtime.    [provider]  pantoprazole (PROTONIX) 40 MG tablet Take 40 mg by mouth daily.    [provider]    Allergies    Patient has no known allergies.  Review of Systems   Review of Systems  All other systems reviewed and are negative.   Physical Exam Updated Vital Signs BP 122/65   Pulse 94   Temp 98.2 F (36.8 C) (Oral)   Resp (!) 21   SpO2 96%   Physical Exam Vitals and nursing note reviewed.  Constitutional:      General: He is not in acute distress.    Appearance: Normal appearance. He is well-developed. He is not toxic-appearing.  HENT:     Head: Normocephalic and atraumatic.  Eyes:     General: Lids are normal.     Conjunctiva/sclera: Conjunctivae normal.     Pupils: Pupils are equal, round, and reactive to light.  Neck:     Thyroid: No thyroid mass.     Trachea: No tracheal deviation.  Cardiovascular:     Rate and Rhythm: Normal rate and regular rhythm.     Heart sounds: Normal heart sounds. No murmur. No gallop.   Pulmonary:     Effort: Pulmonary effort is normal. No respiratory distress.     Breath sounds: Normal  breath sounds. No stridor. No decreased breath sounds, wheezing, rhonchi or rales.  Abdominal:     General: Bowel sounds are normal. There is no distension.     Palpations: Abdomen is soft.     Tenderness: There is no abdominal tenderness. There is no rebound.  Musculoskeletal:        General: No tenderness. Normal range of motion.     Cervical back: Normal range of motion and neck supple.  Skin:    General: Skin is warm and dry.     Findings: No abrasion or rash.  Neurological:     Mental Status: He is alert and oriented to person, place, and time.     GCS: GCS eye subscore is 4. GCS verbal subscore is 5. GCS motor subscore is 6.     Cranial Nerves: No cranial nerve deficit.     Sensory: No sensory deficit.  Psychiatric:        Speech: Speech normal.        Behavior: Behavior normal.     ED Results / Procedures / Treatments     Labs (all labs ordered are listed, but only abnormal results are displayed) Labs Reviewed  BASIC METABOLIC PANEL - Abnormal; Notable for the following components:      Result Value   Glucose, Bld 111 (*)    BUN 27 (*)    Creatinine, Ser 1.27 (*)    GFR calc non Af Amer 51 (*)    GFR calc Af Amer 59 (*)    All other components within normal limits  TROPONIN I (HIGH SENSITIVITY) - Abnormal; Notable for the following components:   Troponin I (High Sensitivity) 21 (*)    All other components within normal limits  TROPONIN I (HIGH SENSITIVITY) - Abnormal; Notable for the following components:   Troponin I (High Sensitivity) 22 (*)    All other components within normal limits  CBC  POC SARS CORONAVIRUS 2 AG -  ED    EKG EKG Interpretation  Date/Time:  Friday January 28 2020 10:49:56 EST Ventricular Rate:  87 PR Interval:  116 QRS Duration: 102 QT Interval:  358 QTC Calculation: 430 R Axis:   48 Text Interpretation: Sinus rhythm with Premature supraventricular complexes Marked ST abnormality, possible lateral subendocardial injury Abnormal ECG Confirmed by Lacretia Leigh ([id number]) on 01/28/2020 3:31:05 PM   Radiology DG Chest 2 View  Result Date: 01/28/2020 CLINICAL DATA:  Weakness, shortness of breath EXAM: CHEST - 2 VIEW COMPARISON:  11/14/2012 FINDINGS: The heart size and mediastinal contours are within normal limits. Calcific aortic knob. Patchy airspace opacity within the peripheral aspect of the left lower lobe. No pleural effusion or pneumothorax. The visualized skeletal structures are unremarkable. IMPRESSION: Patchy airspace disease within the peripheral aspect of the left lower lobe, which may reflect atelectasis versus pneumonia. Electronically Signed   By: Davina Poke D.O.   On: 01/28/2020 11:15    Procedures Procedures (including critical care time)  Medications Ordered in ED Medications  sodium chloride flush (NS) 0.9 % injection 3 mL (0 mLs Intravenous Hold 01/28/20  1418)    ED Course  I have reviewed the triage vital signs and the nursing notes.  Pertinent labs & imaging results that were available during my care of the patient were reviewed by me and considered in my medical decision making (see chart for details).    MDM Rules/Calculators/A&P  Anthony Fields was evaluated in Emergency Department on 01/28/2020 for the symptoms described in the history of present illness. He was evaluated in the context of the global COVID-19 pandemic, which necessitated consideration that the patient might be at risk for infection with the SARS-CoV-2 virus that causes COVID-19. Institutional protocols and algorithms that pertain to the evaluation of patients at risk for COVID-19 are in a state of rapid change based on information released by regulatory bodies including the CDC and federal and state organizations. These policies and algorithms were followed during the patient's care in the ED.  Final Clinical Impression(s) / ED Diagnoses Final diagnoses:  None  Patient's work appears significant for a stable delta troponin as well as a Covid test that was positive.  X-ray is consistent with Covid infection.  Patient states that his cough is been for about 2 weeks.  Unsure of where he is in his disease process.  He is not hypoxic.  States he feels stable to go home.  We will follow-up with his PCP  Rx / DC Orders ED Discharge Orders    None       Lorre Nick, MD 01/28/20 1612    Lorre Nick, MD 01/28/20 [phone]

## 2022-02-23 ENCOUNTER — Emergency Department (HOSPITAL_COMMUNITY)
Admission: EM | Admit: 2022-02-23 | Discharge: 2022-02-23 | Disposition: A | Discharge status: Home / Self Care | Payer: Medicare Other | Attending: Emergency Medicine | Admitting: Emergency Medicine

## 2022-02-23 ENCOUNTER — Other Ambulatory Visit: Payer: Self-pay

## 2022-02-23 ENCOUNTER — Encounter (HOSPITAL_COMMUNITY): Payer: Self-pay | Admitting: *Deleted

## 2022-02-23 DIAGNOSIS — Z79899 Other long term (current) drug therapy: Secondary | ICD-10-CM | POA: Insufficient documentation

## 2022-02-23 DIAGNOSIS — R339 Retention of urine, unspecified: Secondary | ICD-10-CM | POA: Insufficient documentation

## 2022-02-23 DIAGNOSIS — N189 Chronic kidney disease, unspecified: Secondary | ICD-10-CM | POA: Diagnosis not present

## 2022-02-23 LAB — URINALYSIS, ROUTINE W REFLEX MICROSCOPIC
Bacteria, UA: NONE SEEN
Bilirubin Urine: NEGATIVE
Glucose, UA: NEGATIVE mg/dL
Ketones, ur: NEGATIVE mg/dL
Leukocytes,Ua: NEGATIVE
Nitrite: NEGATIVE
Protein, ur: NEGATIVE mg/dL
Specific Gravity, Urine: 1.009 (ref 1.005–1.030)
pH: 6 (ref 5.0–8.0)

## 2022-02-23 LAB — BASIC METABOLIC PANEL
Anion gap: 11 (ref 5–15)
BUN: 22 mg/dL (ref 8–23)
CO2: 23 mmol/L (ref 22–32)
Calcium: 8.6 mg/dL — ABNORMAL LOW (ref 8.9–10.3)
Chloride: 107 mmol/L (ref 98–111)
Creatinine, Ser: 2.45 mg/dL — ABNORMAL HIGH (ref 0.61–1.24)
GFR, Estimated: 25 mL/min — ABNORMAL LOW (ref >60)
Glucose, Bld: 110 mg/dL — ABNORMAL HIGH (ref 70–99)
Potassium: 3.3 mmol/L — ABNORMAL LOW (ref 3.5–5.1)
Sodium: 141 mmol/L (ref 135–145)

## 2022-02-23 LAB — CBC WITH DIFFERENTIAL/PLATELET
Abs Immature Granulocytes: 0.05 10*3/uL (ref 0.00–0.07)
Basophils Absolute: 0.1 10*3/uL (ref 0.0–0.1)
Basophils Relative: 1 %
Eosinophils Absolute: 0.2 10*3/uL (ref 0.0–0.5)
Eosinophils Relative: 2 %
HCT: 37.8 % — ABNORMAL LOW (ref 39.0–52.0)
Hemoglobin: 12 g/dL — ABNORMAL LOW (ref 13.0–17.0)
Immature Granulocytes: 0 %
Lymphocytes Relative: 10 %
Lymphs Abs: 1.3 10*3/uL (ref 0.7–4.0)
MCH: 28.7 pg (ref 26.0–34.0)
MCHC: 31.7 g/dL (ref 30.0–36.0)
MCV: 90.4 fL (ref 80.0–100.0)
Monocytes Absolute: 1.2 10*3/uL — ABNORMAL HIGH (ref 0.1–1.0)
Monocytes Relative: 10 %
Neutro Abs: 9.4 10*3/uL — ABNORMAL HIGH (ref 1.7–7.7)
Neutrophils Relative %: 77 %
Platelets: 249 10*3/uL (ref 150–400)
RBC: 4.18 MIL/uL — ABNORMAL LOW (ref 4.22–5.81)
RDW: 15.7 % — ABNORMAL HIGH (ref 11.5–15.5)
WBC: 12.1 10*3/uL — ABNORMAL HIGH (ref 4.0–10.5)
nRBC: 0 % (ref 0.0–0.2)

## 2022-02-23 NOTE — Discharge Instructions (Signed)
Please call Monday morning schedule follow-up appointment the next 2 to 3 weeks in the urology office.  You should keep the Foley in place until then.  They will likely remove it in the office and see if you are able to urinate.  You should change the bag routinely when it starts to get full (empty the bag). ? ?Your kidney function did get worse from 2 years ago, in comparison.  This is likely because you have been retaining urine for some time.  It is important that your primary care doctor or the urologist rechecked your creatinine or kidney function when you are seen in the office in the next 2 weeks ?

## 2022-02-23 NOTE — ED Triage Notes (Signed)
Pt states for last few days c/o dribbling with urination. Denies any hx of prostate issues.  + lower abd pressure ?

## 2022-02-23 NOTE — ED Provider Notes (Signed)
?[facility] ?Provider Note ? ? ?CSN: [id number] ?Arrival date & time: 02/23/22  1109 ? ?  ? ?History ? ?No chief complaint on file. ? ? ?Jurrell Kintner is a 88 y.o. male here with urinary retention, reports for about 3 days, only dribbling urine.  The patient denies that he has prostate problems or any urinary problems, but his daughter at bedside says that he "dribbles all the time".  He does not see a urologist.  He does have some abdominal fullness ? ? here ? ? ?  ? ?Home Medications ?Prior to Admission medications   ?Medication Sig Start Date End Date Taking? Authorizing Provider  ?amLODipine (NORVASC) 5 MG tablet Take 5 mg by mouth daily.    [provider]  ?atorvastatin (LIPITOR) 20 MG tablet Take 20 mg by mouth daily.    [provider]  ?fenofibrate (TRICOR) 145 MG tablet Take 145 mg by mouth daily.    [provider]  ?losartan (COZAAR) 100 MG tablet Take 100 mg by mouth daily.    [provider]  ?niacin (NIASPAN) 1000 MG CR tablet Take 1,000 mg by mouth at bedtime.    [provider]  ?pantoprazole (PROTONIX) 40 MG tablet Take 40 mg by mouth daily.    [provider]  ?   ? ?Allergies    ?Patient has no known allergies.   ? ?Review of Systems   ?Review of Systems ? ?Physical Exam ?Updated Vital Signs ?BP 140/80   Pulse 86   Temp 98.4 ?F (36.9 ?C) (Oral)   Resp 16   Ht 5\' 8"  (1.727 m)   Wt 81.6 kg   SpO2 98%   BMI 27.37 kg/m?  ?Physical Exam ?Constitutional:   ?   General: He is not in acute distress. ?HENT:  ?   Head: Normocephalic and atraumatic.  ?Eyes:  ?   Conjunctiva/sclera: Conjunctivae normal.  ?   Pupils: Pupils are equal, round, and reactive to light.  ?Cardiovascular:  ?   Rate and Rhythm: Normal rate and regular rhythm.  ?Pulmonary:  ?   Effort: Pulmonary effort is normal. No respiratory distress.  ?Abdominal:  ?   General: There is no distension.  ?   Tenderness: There is no abdominal tenderness.  ?Skin: ?   General:  Skin is warm and dry.  ?Neurological:  ?   General: No focal deficit present.  ?   Mental Status: He is alert. Mental status is at baseline.  ?Psychiatric:     ?   Mood and Affect: Mood normal.     ?   Behavior: Behavior normal.  ? ? ?ED Results / Procedures / Treatments   ?Labs ?(all labs ordered are listed, but only abnormal results are displayed) ?Labs Reviewed  ?URINALYSIS, ROUTINE W REFLEX MICROSCOPIC - Abnormal; Notable for the following components:  ?    Result Value  ? Hgb urine dipstick MODERATE (*)   ? All other components within normal limits  ?BASIC METABOLIC PANEL - Abnormal; Notable for the following components:  ? Potassium 3.3 (*)   ? Glucose, Bld 110 (*)   ? Creatinine, Ser 2.45 (*)   ? Calcium 8.6 (*)   ? GFR, Estimated 25 (*)   ? All other components within normal limits  ?CBC WITH DIFFERENTIAL/PLATELET - Abnormal; Notable for the following components:  ? WBC 12.1 (*)   ? RBC 4.18 (*)   ? Hemoglobin 12.0 (*)   ? HCT 37.8 (*)   ?  RDW 15.7 (*)   ? Neutro Abs 9.4 (*)   ? Monocytes Absolute 1.2 (*)   ? All other components within normal limits  ? ? ?EKG ?None ? ?Radiology ?No results found. ? ?Procedures ?Procedures  ? ? ?Medications Ordered in ED ?Medications - No data to display ? ?ED Course/ Medical Decision Making/ A&P ?Clinical Course as of 02/23/22 1612  ?Sat Feb 23, 2022  ?1146 Bladders can > 999 cc, instructed nurse to insert foley catheter [MT]  ?1406 The patient has had some increased elevation of his creatinine over the past 2 years since his last check, now 2.45.  I suspect this is gradually increasing due to urinary retention and bladder outlet obstruction.  I do think he has stable at this point for outpatient follow-up, which she has a very strong preference for, not wanting to stay in the hospital.  I explained to him and his daughter that a urologist or PCP needs to recheck his kidney function in the next 2 weeks, and stressed the importance of urology follow-up within 2-3 weeks.  I  explained the Foley catheter need to be removed at a minimum within the next month, or becomes an infection risk.  He does have a bit of a leukocytosis today which is suspect is reactive, no flank pain, no fever to suggest pyelonephritis.  Okay for discharge now.  His daughter will take him [MT]  ?  ?Clinical Course User Index ?[MT] Wyvonnia Dusky, MD  ? ?                        ?Medical Decision Making ?Amount and/or Complexity of Data Reviewed ?Labs: ordered. ? ? ?Patient presented with suprapubic discomfort and difficulty urinating at home.  He likely has bladder outlet obstruction, given his age most likely related to enlarged prostate.  Foley catheter was placed upon his arrival with drainage of nearly 2.5 L of urine in periodic drains.  I personally reviewed his urinalysis which does not show any evidence sign of infection.  His pain is significantly relieved.  We will check his kidney function as well.  I reviewed his labs showing some elevation of creatinine ? ?We will leave the Foley in place for now.  Advised urology follow-up.  His daughter was present at the bedside and provide supplemental history, both of them verbalized understanding and agreement with the plan. ? ? ? ? ? ? ? ?Final Clinical Impression(s) / ED Diagnoses ?Final diagnoses:  ?Urinary retention  ?Chronic kidney disease, unspecified CKD stage  ? ? ?Rx / DC Orders ?ED Discharge Orders   ? ? None  ? ?  ? ? ?  ?Wyvonnia Dusky, MD ?02/23/22 1612 ? ?

## 2023-09-02 ENCOUNTER — Emergency Department (HOSPITAL_COMMUNITY): Payer: Medicare Other

## 2023-09-02 ENCOUNTER — Encounter (HOSPITAL_COMMUNITY): Payer: Self-pay | Admitting: Emergency Medicine

## 2023-09-02 ENCOUNTER — Other Ambulatory Visit: Payer: Self-pay

## 2023-09-02 ENCOUNTER — Emergency Department (HOSPITAL_COMMUNITY)
Admission: EM | Admit: 2023-09-02 | Discharge: 2023-09-02 | Disposition: A | Discharge status: Home / Self Care | Payer: Medicare Other | Attending: Emergency Medicine | Admitting: Emergency Medicine

## 2023-09-02 DIAGNOSIS — I480 Paroxysmal atrial fibrillation: Secondary | ICD-10-CM | POA: Insufficient documentation

## 2023-09-02 DIAGNOSIS — R1084 Generalized abdominal pain: Secondary | ICD-10-CM | POA: Diagnosis present

## 2023-09-02 DIAGNOSIS — I10 Essential (primary) hypertension: Secondary | ICD-10-CM | POA: Diagnosis not present

## 2023-09-02 DIAGNOSIS — Z79899 Other long term (current) drug therapy: Secondary | ICD-10-CM | POA: Diagnosis not present

## 2023-09-02 LAB — CBC WITH DIFFERENTIAL/PLATELET
Abs Immature Granulocytes: 0.03 10*3/uL (ref 0.00–0.07)
Basophils Absolute: 0.1 10*3/uL (ref 0.0–0.1)
Basophils Relative: 1 %
Eosinophils Absolute: 0.6 10*3/uL — ABNORMAL HIGH (ref 0.0–0.5)
Eosinophils Relative: 6 %
HCT: 41.8 % (ref 39.0–52.0)
Hemoglobin: 13.4 g/dL (ref 13.0–17.0)
Immature Granulocytes: 0 %
Lymphocytes Relative: 24 %
Lymphs Abs: 2.1 10*3/uL (ref 0.7–4.0)
MCH: 31.2 pg (ref 26.0–34.0)
MCHC: 32.1 g/dL (ref 30.0–36.0)
MCV: 97.4 fL (ref 80.0–100.0)
Monocytes Absolute: 0.5 10*3/uL (ref 0.1–1.0)
Monocytes Relative: 5 %
Neutro Abs: 5.5 10*3/uL (ref 1.7–7.7)
Neutrophils Relative %: 64 %
Platelets: 223 10*3/uL (ref 150–400)
RBC: 4.29 MIL/uL (ref 4.22–5.81)
RDW: 14.6 % (ref 11.5–15.5)
WBC: 8.7 10*3/uL (ref 4.0–10.5)
nRBC: 0 % (ref 0.0–0.2)

## 2023-09-02 LAB — COMPREHENSIVE METABOLIC PANEL
ALT: 15 U/L (ref 0–44)
AST: 17 U/L (ref 15–41)
Albumin: 3.4 g/dL — ABNORMAL LOW (ref 3.5–5.0)
Alkaline Phosphatase: 45 U/L (ref 38–126)
Anion gap: 10 (ref 5–15)
BUN: 14 mg/dL (ref 8–23)
CO2: 22 mmol/L (ref 22–32)
Calcium: 8.5 mg/dL — ABNORMAL LOW (ref 8.9–10.3)
Chloride: 106 mmol/L (ref 98–111)
Creatinine, Ser: 1.04 mg/dL (ref 0.61–1.24)
GFR, Estimated: >60 mL/min (ref >60)
Glucose, Bld: 141 mg/dL — ABNORMAL HIGH (ref 70–99)
Potassium: 3.4 mmol/L — ABNORMAL LOW (ref 3.5–5.1)
Sodium: 138 mmol/L (ref 135–145)
Total Bilirubin: 1.1 mg/dL (ref 0.3–1.2)
Total Protein: 6.4 g/dL — ABNORMAL LOW (ref 6.5–8.1)

## 2023-09-02 LAB — URINALYSIS, ROUTINE W REFLEX MICROSCOPIC
Bilirubin Urine: NEGATIVE
Glucose, UA: 50 mg/dL — AB
Hgb urine dipstick: NEGATIVE
Ketones, ur: 5 mg/dL — AB
Leukocytes,Ua: NEGATIVE
Nitrite: NEGATIVE
Protein, ur: NEGATIVE mg/dL
Specific Gravity, Urine: >1.046 — ABNORMAL HIGH (ref 1.005–1.030)
pH: 6 (ref 5.0–8.0)

## 2023-09-02 LAB — LIPASE, BLOOD: Lipase: 32 U/L (ref 11–51)

## 2023-09-02 MED ORDER — IOHEXOL 300 MG/ML  SOLN
100.0000 mL | Freq: Once | INTRAMUSCULAR | Status: AC | PRN
  Administered 2023-09-02: 100 mL via INTRAVENOUS

## 2023-09-02 MED ORDER — FENTANYL CITRATE PF 50 MCG/ML IJ SOSY
50.0000 ug | PREFILLED_SYRINGE | Freq: Once | INTRAMUSCULAR | Status: AC
  Administered 2023-09-02: 50 ug via INTRAVENOUS
  Filled 2023-09-02: qty 1

## 2023-09-02 MED ORDER — SODIUM CHLORIDE 0.9 % IV BOLUS
1000.0000 mL | Freq: Once | INTRAVENOUS | Status: AC
  Administered 2023-09-02: 1000 mL via INTRAVENOUS

## 2023-09-02 MED ORDER — ONDANSETRON HCL 4 MG/2ML IJ SOLN
4.0000 mg | Freq: Once | INTRAMUSCULAR | Status: AC
  Administered 2023-09-02: 4 mg via INTRAVENOUS
  Filled 2023-09-02: qty 2

## 2023-09-02 NOTE — Consult Note (Signed)
Reason for Consult: Partial small bowel obstruction Referring Physician: Dr. [name] is an 89 y.o. male.  HPI: Patient is an 89 year old white male who presented to the emergency room this morning with acute onset of mid abdominal pain.  He also states he had the shakes.  He presented to the emergency room for further evaluation and treatment.  He denies any chest pain, nausea, vomiting.  He states his bowel movements have been regular.  Denies any constipation or diarrhea.  He denies any history of abdominal surgery.  He was noted in the emergency room to have atrial fibrillation which he was not aware of.  Past Medical History:  Diagnosis Date   Acid reflux    Hand deformity, congenital    right hand -fingers   High cholesterol    Hypertension     Past Surgical History:  Procedure Laterality Date   CARDIAC CATHETERIZATION     CORONARY ANGIOPLASTY WITH STENT PLACEMENT     I & D EXTREMITY  11/14/2012   Procedure: IRRIGATION AND DEBRIDEMENT EXTREMITY;  Surgeon: Johnette Abraham, MD;  Location: [facility] OR;  Service: Plastics;  Laterality: Right;    History reviewed. No pertinent family history.  Social History:  reports that he has quit smoking. He does not have any smokeless tobacco history on file. He reports current alcohol use. He reports that he does not use drugs.  Allergies: No Known Allergies  Medications: Prior to Admission: (Not in a hospital admission)   Results for orders placed or performed during the hospital encounter of 09/02/23 (from the past 48 hour(s))  CBC with Differential     Status: Abnormal   Collection Time: 09/02/23  5:41 AM  Result Value Ref Range   WBC 8.7 4.0 - 10.5 K/uL   RBC 4.29 4.22 - 5.81 MIL/uL   Hemoglobin 13.4 13.0 - 17.0 g/dL   HCT 54.0 98.1 - 19.1 %   MCV 97.4 80.0 - 100.0 fL   MCH 31.2 26.0 - 34.0 pg   MCHC 32.1 30.0 - 36.0 g/dL   RDW 47.8 29.5 - 62.1 %   Platelets 223 150 - 400 K/uL   nRBC 0.0 0.0 - 0.2 %   Neutrophils Relative %  64 %   Neutro Abs 5.5 1.7 - 7.7 K/uL   Lymphocytes Relative 24 %   Lymphs Abs 2.1 0.7 - 4.0 K/uL   Monocytes Relative 5 %   Monocytes Absolute 0.5 0.1 - 1.0 K/uL   Eosinophils Relative 6 %   Eosinophils Absolute 0.6 (H) 0.0 - 0.5 K/uL   Basophils Relative 1 %   Basophils Absolute 0.1 0.0 - 0.1 K/uL   Immature Granulocytes 0 %   Abs Immature Granulocytes 0.03 0.00 - 0.07 K/uL    Comment: Performed at [facility], [address] [place], [place] [address]  Comprehensive metabolic panel     Status: Abnormal   Collection Time: 09/02/23  5:41 AM  Result Value Ref Range   Sodium 138 135 - 145 mmol/L   Potassium 3.4 (L) 3.5 - 5.1 mmol/L   Chloride 106 98 - 111 mmol/L   CO2 22 22 - 32 mmol/L   Glucose, Bld 141 (H) 70 - 99 mg/dL    Comment: Glucose reference range applies only to samples taken after fasting for at least 8 hours.   BUN 14 8 - 23 mg/dL   Creatinine, Ser 7.84 0.61 - 1.24 mg/dL   Calcium 8.5 (L) 8.9 - 10.3 mg/dL  Total Protein 6.4 (L) 6.5 - 8.1 g/dL   Albumin 3.4 (L) 3.5 - 5.0 g/dL   AST 17 15 - 41 U/L   ALT 15 0 - 44 U/L   Alkaline Phosphatase 45 38 - 126 U/L   Total Bilirubin 1.1 0.3 - 1.2 mg/dL   GFR, Estimated >09 >81 mL/min    Comment: (NOTE) Calculated using the CKD-EPI Creatinine Equation (2021)    Anion gap 10 5 - 15    Comment: Performed at [facility], [address], [place], [place] [address]  Lipase, blood     Status: None   Collection Time: 09/02/23  5:41 AM  Result Value Ref Range   Lipase 32 11 - 51 U/L    Comment: Performed at [facility], [address] [place], [place] [address]  Urinalysis, Routine w reflex microscopic -Urine, Clean Catch     Status: Abnormal   Collection Time: 09/02/23  8:36 AM  Result Value Ref Range   Color, Urine YELLOW YELLOW   APPearance CLEAR CLEAR   Specific Gravity, Urine >1.046 (H) 1.005 - 1.030   pH 6.0 5.0 - 8.0   Glucose, UA 50 (A) NEGATIVE mg/dL   Hgb urine dipstick NEGATIVE NEGATIVE   Bilirubin Urine  NEGATIVE NEGATIVE   Ketones, ur 5 (A) NEGATIVE mg/dL   Protein, ur NEGATIVE NEGATIVE mg/dL   Nitrite NEGATIVE NEGATIVE   Leukocytes,Ua NEGATIVE NEGATIVE    Comment: Performed at [facility], [address], [place], [place] [address]    CT ABDOMEN PELVIS W CONTRAST  Result Date: 09/02/2023 CLINICAL DATA:  Abdominal pain that radiates around to the left flank and back. Pain woke patient from sleep at 2 a.m. EXAM: CT ABDOMEN AND PELVIS WITH CONTRAST TECHNIQUE: Multidetector CT imaging of the abdomen and pelvis was performed using the standard protocol following bolus administration of intravenous contrast. RADIATION DOSE REDUCTION: This exam was performed according to the departmental dose-optimization program which includes automated exposure control, adjustment of the mA and/or kV according to patient size and/or use of iterative reconstruction technique. CONTRAST:  OMNIPAQUE IOHEXOL 300 MG/ML  SOLN COMPARISON:  None Available. FINDINGS: Lower chest: Advanced changes of emphysema with bilateral pleuroparenchymal scarring. Hepatobiliary: No focal liver abnormality is seen. No gallstones, gallbladder wall thickening, or biliary dilatation. Pancreas: Unremarkable. No pancreatic ductal dilatation or surrounding inflammatory changes. Spleen: Normal in size without focal abnormality. Adrenals/Urinary Tract: Normal adrenal glands. Bilateral renal calcifications are likely vascular in nature. Small cyst off the upper pole of the right kidney measures 1.3 cm. No follow-up imaging recommended. No obstructive uropathy. Urinary bladder appears normal. Stomach/Bowel: Small hiatal hernia. The appendix is visualized and appears within normal limits. Within the right hemiabdomen there are several adjacent loops of dilated small bowel with mild wall thickening and surrounding soft tissue stranding with edema in the mesentery. These bowel loops measure up to 3.5 cm in caliber. The small bowel proximal this level is  decreased in caliber, image 50/2. Similarly, the small bowel distal to these dilated loops are decreased in caliber, image 49/2. These findings are suggestive of a closed loop obstruction which may be secondary to internal hernia. The remaining bowel loops are within normal limits. Distal colonic diverticulosis without signs of acute diverticulitis. Vascular/Lymphatic: Aortic atherosclerosis. The infrarenal abdominal aorta measures 3.4 cm, image 39/2. No signs of abdominopelvic adenopathy. Reproductive: Prostate gland enlargement. Other: No significant free fluid or fluid collections. There is a small amount of fluid overlying the spleen and extending along the right  pericolic gutter. Musculoskeletal: Lumbar spondylosis. Age-indeterminate superior endplate deformity identified at the L1 level with loss of approximately 15% of the superior body height at this level. IMPRESSION: 1. There are several adjacent loops of dilated small bowel with mild wall thickening and surrounding soft tissue stranding with edema in the mesentery. The small bowel proximal to this level is decreased in caliber. Similarly, the small bowel distal to these dilated loops are decreased in caliber. These findings are concerning for of a closed loop obstruction which may be secondary to internal hernia. 2. Small amount of fluid overlying the spleen and extending along the right pericolic gutter. 3. Distal colonic diverticulosis without signs of acute diverticulitis. 4. Age-indeterminate superior endplate deformity identified at the L1 level with loss of approximately 15% of the superior body height at this level. 5. Abdominal aortic aneurysm measuring 3.4 cm. Recommend surveillance ultrasound in 3 years. Reference: Journal of Vascular Surgery 67.1 (2018): 2-77. J Am Coll Radiol 2013;10:789-794. 6. Aortic Atherosclerosis (ICD10-I70.0) and Emphysema (ICD10-J43.9). Electronically Signed   By: Signa Kell M.D.   On: 09/02/2023 07:14    ROS:   Pertinent items are noted in HPI.  Blood pressure 124/83, pulse (!) 117, temperature (!) 97.3 F (36.3 C), temperature source Rectal, resp. rate 18, height 5\' 8"  (1.727 m), weight 84.6 kg, SpO2 94%. Physical Exam: Well-developed well-nourished white male no acute distress Head is normocephalic, atraumatic Lungs are clear to auscultation with equal breath sounds bilaterally Heart examination reveals an irregular rate and rhythm Abdomen is soft, nontender, nondistended.  Bowel sounds are present.  No rigidity is noted.  CT scan images personally reviewed  Assessment/Plan: Impression: Partial small bowel obstruction of unknown etiology.  There is a suggestion of a possible internal hernia, though patient has never had any abdominal surgery.  He currently has no symptoms and strongly desires to go home.  Admission was offered.  At this point, he does not need acute surgical intervention.  He was given strict instructions to return to the emergency room should his abdominal pain recur and worsen, or develops nausea and vomiting.  Is to follow-up with cardiology concerning his new onset atrial fibrillation.  Anthony Fields 09/02/2023, 9:34 AM

## 2023-09-02 NOTE — ED Triage Notes (Signed)
Pt to ed pov c/o of abdominal pain that radiates around to left flank and back. Pain woke pt up from sleep around 0200. Denies n/v/d/f

## 2023-09-02 NOTE — ED Provider Notes (Signed)
Anthony Fields   CSN: [id number] Arrival date & time: 09/02/23  0502     History  Chief Complaint  Patient presents with   Abdominal Pain    Anthony Fields is a 89 y.o. male.  The history is provided by the patient and a relative.  Patient presents for abdominal pain.  Patient reports he was in his usual state of health when he went to bed, but then woke up with generalized severe abdominal pain and into his back.  No fevers or vomiting.  No chest pain.  He has been able to urinate.  No new weakness is reported. Denies previous abdominal surgery    Home Medications Prior to Admission medications   Medication Sig Start Date End Date Taking? Authorizing Provider  amLODipine (NORVASC) 5 MG tablet Take 5 mg by mouth daily.    [provider]  atorvastatin (LIPITOR) 20 MG tablet Take 20 mg by mouth daily.    [provider]  fenofibrate (TRICOR) 145 MG tablet Take 145 mg by mouth daily.    [provider]  losartan (COZAAR) 100 MG tablet Take 100 mg by mouth daily.    [provider]  niacin (NIASPAN) 1000 MG CR tablet Take 1,000 mg by mouth at bedtime.    [provider]  pantoprazole (PROTONIX) 40 MG tablet Take 40 mg by mouth daily.    [provider]      Allergies    Patient has no known allergies.    Review of Systems   Review of Systems  Constitutional:  Negative for fever.  Gastrointestinal:  Positive for abdominal pain. Negative for diarrhea and vomiting.  Genitourinary:  Negative for difficulty urinating and dysuria.  Musculoskeletal:  Positive for back pain.    Physical Exam Updated Vital Signs BP 124/65   Pulse 86   Temp (!) 97.3 F (36.3 C) (Rectal)   Resp 13   Ht 1.727 m (5\' 8" )   Wt 84.6 kg   SpO2 100%   BMI 28.36 kg/m  Physical Exam CONSTITUTIONAL: Elderly, uncomfortable appearing HEAD: Normocephalic/atraumatic EYES: EOMI CV: No loud harsh  murmur LUNGS: Lungs are clear to auscultation bilaterally, no apparent distress ABDOMEN: soft, mild generalized abdominal tenderness, no rebound or guarding, bowel sounds noted throughout abdomen, no distention GU: Chaperone present for exam, daughter at bedside per patient consent No inguinal hernia noted.  No scrotal tenderness.  No erythema, no abscess noted NEURO: Pt is awake/alert/appropriate, moves all extremitiesx4.  No facial droop.   EXTREMITIES: pulses normal/equal, full ROM Equal femoral pulses are noted SKIN: warm, color normal PSYCH: mildly anxious  ED Results / Procedures / Treatments   Labs (all labs ordered are listed, but only abnormal results are displayed) Labs Reviewed  CBC WITH DIFFERENTIAL/PLATELET - Abnormal; Notable for the following components:      Result Value   Eosinophils Absolute 0.6 (*)    All other components within normal limits  COMPREHENSIVE METABOLIC PANEL - Abnormal; Notable for the following components:   Potassium 3.4 (*)    Glucose, Bld 141 (*)    Calcium 8.5 (*)    Total Protein 6.4 (*)    Albumin 3.4 (*)    All other components within normal limits  LIPASE, BLOOD  URINALYSIS, ROUTINE W REFLEX MICROSCOPIC    EKG EKG Interpretation Date/Time:  Tuesday September 02 2023 05:38:45 EDT Ventricular Rate:  79 PR Interval:    QRS Duration:  90 QT Interval:  482 QTC Calculation: 553 R Axis:   75  Text Interpretation: Atrial fibrillation Nonspecific T abnormalities, lateral leads Prolonged QT interval Confirmed by Zadie Rhine ([id number]) on 09/02/2023 5:59:52 AM  Radiology No results found.  Procedures Procedures    Medications Ordered in ED Medications  fentaNYL (SUBLIMAZE) injection 50 mcg (50 mcg Intravenous Given 09/02/23 0602)  ondansetron (ZOFRAN) injection 4 mg (4 mg Intravenous Given 09/02/23 0601)  fentaNYL (SUBLIMAZE) injection 50 mcg (50 mcg Intravenous Given 09/02/23 0652)  iohexol (OMNIPAQUE) 300 MG/ML solution 100 mL (100  mLs Intravenous Contrast Given 09/02/23 0629)  sodium chloride 0.9 % bolus 1,000 mL (1,000 mLs Intravenous New Bag/Given 09/02/23 6045)    ED Course/ Medical Decision Making/ A&P Clinical Course as of 09/02/23 0715  Tue Sep 02, 2023  0544 Patient presents with onset of abdominal pain and back pain that began tonight.  He appears uncomfortable.  Patient will need CT imaging. [DW]  0600 Initial EKG reveals A-fib, unclear time of onset.  Patient is on clopidogrel at baseline, but no other anticoagulants [DW]  0623 Glucose(!): 141 Mild hyperglycemia [DW]  0701 At signout to Dr. Elayne Snare, f/u on CT imaging and urinalysis  [DW]  0715 Will give referral to cardiology for atrial fibrillation.  Will defer anticoagulation, patient is on Plavix [DW]    Clinical Course User Index [DW] Zadie Rhine, MD         CHA2DS2-VASc Score: 4                        Medical Decision Making Amount and/or Complexity of Data Reviewed Labs: ordered. Decision-making details documented in ED Course. Radiology: ordered. ECG/medicine tests: ordered.  Risk Prescription drug management.   This patient presents to the ED for concern of abdominal pain, this involves an extensive number of treatment options, and is a complaint that carries with it a high risk of complications and morbidity.  The differential diagnosis includes but is not limited to cholecystitis, cholelithiasis, pancreatitis, gastritis, peptic ulcer disease, appendicitis, bowel obstruction, bowel perforation, diverticulitis, AAA, ischemic bowel   Comorbidities that complicate the patient evaluation: Patient's presentation is complicated by their history of hypertension  Social Determinants of Health: Patient's  hard of hearing   increases the complexity of managing their presentation Patient lives alone  Additional history obtained: Additional history obtained from family Records reviewed previous admission documents  Lab Tests: I Ordered, and  personally interpreted labs.  The pertinent results include:  labs overall unremarkable  Imaging Studies ordered: I ordered imaging studies including CT scan abdomen/pelvis     Medicines ordered and prescription drug management: I ordered medication including fentanyl for pain Reevaluation of the patient after these medicines showed that the patient    stayed the same   Reevaluation: After the interventions noted above, I reevaluated the patient and found that they have :stayed the same  Complexity of problems addressed: Patient's presentation is most consistent with  acute presentation with potential threat to life or bodily function         Final Clinical Impression(s) / ED Diagnoses Final diagnoses:  Generalized abdominal pain  Paroxysmal atrial fibrillation [facility])    Rx / DC Orders ED Discharge Orders          Ordered    Ambulatory referral to Cardiology       Comments: New onset afib   09/02/23 0715              Zadie Rhine, MD 09/02/23  0715  

## 2023-09-02 NOTE — Discharge Instructions (Addendum)
You were seen in the emergency department for abdominal pain The CAT scan taken did show a question of bowel obstruction however you were able to eat and drink and were not having pain You were seen by our surgery team here We feel comfortable with you going home as long as you know what to come back for You should return to the emergency department at once if you have severe pain, if you are unable to eat or drink or have any other concerns Your CAT scan also showed an abdominal aortic aneurysm which will need to be followed by your primary doctor Please discuss management of your atrial fibrillation with your primary doctor as well and continue taking all previously prescribed medications

## 2023-09-02 NOTE — ED Provider Notes (Signed)
  Physical Exam  BP 124/83   Pulse (!) 117   Temp (!) 97.3 F (36.3 C) (Rectal)   Resp 18   Ht 5\' 8"  (1.727 m)   Wt 84.6 kg   SpO2 94%   BMI 28.36 kg/m   Physical Exam Vitals and nursing note reviewed.  HENT:     Head: Normocephalic and atraumatic.  Eyes:     Pupils: Pupils are equal, round, and reactive to light.  Cardiovascular:     Rate and Rhythm: Normal rate and regular rhythm.  Pulmonary:     Effort: Pulmonary effort is normal.     Breath sounds: Normal breath sounds.  Abdominal:     Palpations: Abdomen is soft.     Tenderness: There is no abdominal tenderness.  Skin:    General: Skin is warm and dry.  Neurological:     Mental Status: He is alert.  Psychiatric:        Mood and Affect: Mood normal.     Procedures  Procedures  ED Course / MDM   Clinical Course as of 09/02/23 1013  Tue Sep 02, 2023  0544 Patient presents with onset of abdominal pain and back pain that began tonight.  He appears uncomfortable.  Patient will need CT imaging. [DW]  0600 Initial EKG reveals A-fib, unclear time of onset.  Patient is on clopidogrel at baseline, but no other anticoagulants [DW]  0623 Glucose(!): 141 Mild hyperglycemia [DW]  0701 At signout to Dr. Elayne Snare, f/u on CT imaging and urinalysis  [DW]  0715 Will give referral to cardiology for atrial fibrillation.  Will defer anticoagulation, patient is on Plavix [DW]  0806 CT abdomen pelvis reveals findings concerning for bowel obstruction along with abdominal aortic aneurysm measuring 3.4 cm.  AAA will need to be followed.  Will reach out to surgery regarding bowel obstruction [MP]  0829 Discussed with Dr. Lovell Sheehan (general surgery) who will evaluate patient in the ED this morning [MP]  1009 Patient has been evaluated by Dr. Lovell Sheehan in the ED.  Considering reassuring clinical examination and patient's ability to tolerate p.o. intake, Dr. Lovell Sheehan feels comfortable with plan for discharge home.  The patient and his family members  at bedside understand the return precautions that would be worrisome for an acute obstructive process and he will return to the emergency department at once if he has any signs/symptoms [MP]  1011 No evidence of urinary tract infection or leukocytosis.  No significant metabolic disturbances [MP]    Clinical Course User Index [DW] Zadie Rhine, MD [MP] Royanne Foots, DO   Medical Decision Making I, Estelle June DO, have assumed care of this patient from the previous provider pending CT imaging, remainder laboratory workup and disposition  Amount and/or Complexity of Data Reviewed Labs: ordered. Decision-making details documented in ED Course. Radiology: ordered. ECG/medicine tests: ordered.  Risk Prescription drug management.   Final diagnosis Abdominal pain Paroxysmal atrial fibrillation       Royanne Foots, DO 09/02/23 1013

## 2024-05-24 ENCOUNTER — Emergency Department (HOSPITAL_COMMUNITY)

## 2024-05-24 ENCOUNTER — Encounter (HOSPITAL_COMMUNITY): Payer: Self-pay

## 2024-05-24 ENCOUNTER — Inpatient Hospital Stay (HOSPITAL_COMMUNITY)
Admission: EM | Admit: 2024-05-24 | Discharge: 2024-06-01 | DRG: 698 | Disposition: A | Discharge status: Home Health | Attending: Internal Medicine | Admitting: Internal Medicine

## 2024-05-24 ENCOUNTER — Other Ambulatory Visit: Payer: Self-pay

## 2024-05-24 DIAGNOSIS — E8729 Other acidosis: Secondary | ICD-10-CM | POA: Diagnosis not present

## 2024-05-24 DIAGNOSIS — Y846 Urinary catheterization as the cause of abnormal reaction of the patient, or of later complication, without mention of misadventure at the time of the procedure: Secondary | ICD-10-CM | POA: Diagnosis present

## 2024-05-24 DIAGNOSIS — T83511A Infection and inflammatory reaction due to indwelling urethral catheter, initial encounter: Principal | ICD-10-CM | POA: Diagnosis present

## 2024-05-24 DIAGNOSIS — I272 Pulmonary hypertension, unspecified: Secondary | ICD-10-CM | POA: Diagnosis present

## 2024-05-24 DIAGNOSIS — W44F3XA Food entering into or through a natural orifice, initial encounter: Secondary | ICD-10-CM | POA: Diagnosis not present

## 2024-05-24 DIAGNOSIS — A419 Sepsis, unspecified organism: Principal | ICD-10-CM | POA: Diagnosis present

## 2024-05-24 DIAGNOSIS — T17928A Food in respiratory tract, part unspecified causing other injury, initial encounter: Secondary | ICD-10-CM | POA: Diagnosis not present

## 2024-05-24 DIAGNOSIS — R6521 Severe sepsis with septic shock: Secondary | ICD-10-CM | POA: Diagnosis present

## 2024-05-24 DIAGNOSIS — L89152 Pressure ulcer of sacral region, stage 2: Secondary | ICD-10-CM | POA: Diagnosis present

## 2024-05-24 DIAGNOSIS — R578 Other shock: Secondary | ICD-10-CM | POA: Diagnosis not present

## 2024-05-24 DIAGNOSIS — E7800 Pure hypercholesterolemia, unspecified: Secondary | ICD-10-CM | POA: Diagnosis present

## 2024-05-24 DIAGNOSIS — I071 Rheumatic tricuspid insufficiency: Secondary | ICD-10-CM | POA: Diagnosis present

## 2024-05-24 DIAGNOSIS — G9341 Metabolic encephalopathy: Secondary | ICD-10-CM | POA: Diagnosis present

## 2024-05-24 DIAGNOSIS — I110 Hypertensive heart disease with heart failure: Secondary | ICD-10-CM | POA: Diagnosis present

## 2024-05-24 DIAGNOSIS — D649 Anemia, unspecified: Secondary | ICD-10-CM | POA: Diagnosis present

## 2024-05-24 DIAGNOSIS — Y92239 Unspecified place in hospital as the place of occurrence of the external cause: Secondary | ICD-10-CM | POA: Diagnosis not present

## 2024-05-24 DIAGNOSIS — I4891 Unspecified atrial fibrillation: Secondary | ICD-10-CM

## 2024-05-24 DIAGNOSIS — J9601 Acute respiratory failure with hypoxia: Secondary | ICD-10-CM | POA: Diagnosis present

## 2024-05-24 DIAGNOSIS — I7123 Aneurysm of the descending thoracic aorta, without rupture: Secondary | ICD-10-CM | POA: Diagnosis present

## 2024-05-24 DIAGNOSIS — Z79899 Other long term (current) drug therapy: Secondary | ICD-10-CM

## 2024-05-24 DIAGNOSIS — E876 Hypokalemia: Secondary | ICD-10-CM | POA: Diagnosis present

## 2024-05-24 DIAGNOSIS — I5023 Acute on chronic systolic (congestive) heart failure: Secondary | ICD-10-CM | POA: Diagnosis present

## 2024-05-24 DIAGNOSIS — I4819 Other persistent atrial fibrillation: Secondary | ICD-10-CM | POA: Diagnosis present

## 2024-05-24 DIAGNOSIS — N319 Neuromuscular dysfunction of bladder, unspecified: Secondary | ICD-10-CM | POA: Diagnosis present

## 2024-05-24 DIAGNOSIS — Z87440 Personal history of urinary (tract) infections: Secondary | ICD-10-CM

## 2024-05-24 DIAGNOSIS — E872 Acidosis, unspecified: Secondary | ICD-10-CM | POA: Diagnosis present

## 2024-05-24 DIAGNOSIS — I5021 Acute systolic (congestive) heart failure: Secondary | ICD-10-CM

## 2024-05-24 DIAGNOSIS — J189 Pneumonia, unspecified organism: Secondary | ICD-10-CM | POA: Diagnosis present

## 2024-05-24 DIAGNOSIS — Z955 Presence of coronary angioplasty implant and graft: Secondary | ICD-10-CM

## 2024-05-24 DIAGNOSIS — L8990 Pressure ulcer of unspecified site, unspecified stage: Secondary | ICD-10-CM | POA: Insufficient documentation

## 2024-05-24 DIAGNOSIS — F05 Delirium due to known physiological condition: Secondary | ICD-10-CM | POA: Diagnosis not present

## 2024-05-24 DIAGNOSIS — R042 Hemoptysis: Secondary | ICD-10-CM | POA: Diagnosis present

## 2024-05-24 DIAGNOSIS — Z1152 Encounter for screening for COVID-19: Secondary | ICD-10-CM | POA: Diagnosis not present

## 2024-05-24 DIAGNOSIS — Z87891 Personal history of nicotine dependence: Secondary | ICD-10-CM

## 2024-05-24 DIAGNOSIS — Z7901 Long term (current) use of anticoagulants: Secondary | ICD-10-CM

## 2024-05-24 HISTORY — DX: Unspecified atrial fibrillation: I48.91

## 2024-05-24 LAB — URINALYSIS, W/ REFLEX TO CULTURE (INFECTION SUSPECTED)
Bacteria, UA: NONE SEEN
Bilirubin Urine: NEGATIVE
Glucose, UA: 50 mg/dL — AB
Ketones, ur: NEGATIVE mg/dL
Nitrite: NEGATIVE
Protein, ur: 100 mg/dL — AB
RBC / HPF: >50 RBC/hpf (ref 0–5)
Specific Gravity, Urine: 1.029 (ref 1.005–1.030)
WBC, UA: >50 WBC/hpf (ref 0–5)
pH: 5 (ref 5.0–8.0)

## 2024-05-24 LAB — CBC WITH DIFFERENTIAL/PLATELET
Abs Immature Granulocytes: 0.07 10*3/uL (ref 0.00–0.07)
Basophils Absolute: 0.1 10*3/uL (ref 0.0–0.1)
Basophils Relative: 0 %
Eosinophils Absolute: 0.1 10*3/uL (ref 0.0–0.5)
Eosinophils Relative: 1 %
HCT: 37.3 % — ABNORMAL LOW (ref 39.0–52.0)
Hemoglobin: 12 g/dL — ABNORMAL LOW (ref 13.0–17.0)
Immature Granulocytes: 1 %
Lymphocytes Relative: 11 %
Lymphs Abs: 1.4 10*3/uL (ref 0.7–4.0)
MCH: 29.9 pg (ref 26.0–34.0)
MCHC: 32.2 g/dL (ref 30.0–36.0)
MCV: 93 fL (ref 80.0–100.0)
Monocytes Absolute: 0.7 10*3/uL (ref 0.1–1.0)
Monocytes Relative: 6 %
Neutro Abs: 10.1 10*3/uL — ABNORMAL HIGH (ref 1.7–7.7)
Neutrophils Relative %: 81 %
Platelets: 256 10*3/uL (ref 150–400)
RBC: 4.01 MIL/uL — ABNORMAL LOW (ref 4.22–5.81)
RDW: 17.4 % — ABNORMAL HIGH (ref 11.5–15.5)
WBC: 12.4 10*3/uL — ABNORMAL HIGH (ref 4.0–10.5)
nRBC: 0 % (ref 0.0–0.2)

## 2024-05-24 LAB — COMPREHENSIVE METABOLIC PANEL WITH GFR
ALT: 37 U/L (ref 0–44)
AST: 54 U/L — ABNORMAL HIGH (ref 15–41)
Albumin: 2.6 g/dL — ABNORMAL LOW (ref 3.5–5.0)
Alkaline Phosphatase: 71 U/L (ref 38–126)
Anion gap: 11 (ref 5–15)
BUN: 14 mg/dL (ref 8–23)
CO2: 19 mmol/L — ABNORMAL LOW (ref 22–32)
Calcium: 8.8 mg/dL — ABNORMAL LOW (ref 8.9–10.3)
Chloride: 107 mmol/L (ref 98–111)
Creatinine, Ser: 0.93 mg/dL (ref 0.61–1.24)
GFR, Estimated: >60 mL/min (ref >60)
Glucose, Bld: 181 mg/dL — ABNORMAL HIGH (ref 70–99)
Potassium: 3.8 mmol/L (ref 3.5–5.1)
Sodium: 137 mmol/L (ref 135–145)
Total Bilirubin: 1.8 mg/dL — ABNORMAL HIGH (ref 0.0–1.2)
Total Protein: 6 g/dL — ABNORMAL LOW (ref 6.5–8.1)

## 2024-05-24 LAB — BLOOD GAS, ARTERIAL
Acid-base deficit: 4.2 mmol/L — ABNORMAL HIGH (ref 0.0–2.0)
Bicarbonate: 21 mmol/L (ref 20.0–28.0)
Drawn by: 28340
O2 Saturation: 99.4 %
Patient temperature: 37.4
pCO2 arterial: 39 mmHg (ref 32–48)
pH, Arterial: 7.34 — ABNORMAL LOW (ref 7.35–7.45)
pO2, Arterial: 157 mmHg — ABNORMAL HIGH (ref 83–108)

## 2024-05-24 LAB — RESP PANEL BY RT-PCR (RSV, FLU A&B, COVID)  RVPGX2
Influenza A by PCR: NEGATIVE
Influenza B by PCR: NEGATIVE
Resp Syncytial Virus by PCR: NEGATIVE
SARS Coronavirus 2 by RT PCR: NEGATIVE

## 2024-05-24 LAB — PROTIME-INR
INR: 2.7 — ABNORMAL HIGH (ref 0.8–1.2)
Prothrombin Time: 29.5 s — ABNORMAL HIGH (ref 11.4–15.2)

## 2024-05-24 LAB — BRAIN NATRIURETIC PEPTIDE: B Natriuretic Peptide: 479 pg/mL — ABNORMAL HIGH (ref 0.0–100.0)

## 2024-05-24 LAB — LACTIC ACID, PLASMA
Lactic Acid, Venous: 2.9 mmol/L (ref 0.5–1.9)
Lactic Acid, Venous: 2.1 mmol/L (ref 0.5–1.9)

## 2024-05-24 LAB — TROPONIN I (HIGH SENSITIVITY): Troponin I (High Sensitivity): 23 ng/L — ABNORMAL HIGH (ref <18)

## 2024-05-24 MED ORDER — LACTATED RINGERS IV SOLN: INTRAVENOUS | Status: DC

## 2024-05-24 MED ORDER — VANCOMYCIN HCL IN DEXTROSE 1-5 GM/200ML-% IV SOLN
1000.0000 mg | Freq: Once | INTRAVENOUS | Status: AC
  Administered 2024-05-24: 1000 mg via INTRAVENOUS
  Filled 2024-05-24: qty 200

## 2024-05-24 MED ORDER — MIDAZOLAM-SODIUM CHLORIDE 100-0.9 MG/100ML-% IV SOLN
0.5000 mg/h | INTRAVENOUS | Status: DC
  Administered 2024-05-24: 0.5 mg/h via INTRAVENOUS
  Filled 2024-05-24: qty 100

## 2024-05-24 MED ORDER — SODIUM CHLORIDE 0.9 % IV SOLN
2.0000 g | Freq: Once | INTRAVENOUS | Status: AC
  Administered 2024-05-24: 2 g via INTRAVENOUS
  Filled 2024-05-24: qty 12.5

## 2024-05-24 MED ORDER — LORAZEPAM 2 MG/ML IJ SOLN
1.0000 mg | Freq: Once | INTRAMUSCULAR | Status: AC
  Administered 2024-05-24: 1 mg via INTRAVENOUS
  Filled 2024-05-24: qty 1

## 2024-05-24 MED ORDER — NOREPINEPHRINE 4 MG/250ML-% IV SOLN
0.0000 ug/min | INTRAVENOUS | Status: DC
  Administered 2024-05-25: 2 ug/min via INTRAVENOUS
  Administered 2024-05-26: 4 ug/min via INTRAVENOUS
  Filled 2024-05-24 (×2): qty 250

## 2024-05-24 MED ORDER — ROCURONIUM BROMIDE 10 MG/ML (PF) SYRINGE: 80.0000 mg | PREFILLED_SYRINGE | Freq: Once | INTRAVENOUS | Status: AC

## 2024-05-24 MED ORDER — FENTANYL 2500MCG IN NS 250ML (10MCG/ML) PREMIX INFUSION
0.0000 ug/h | INTRAVENOUS | Status: DC
  Administered 2024-05-24: 25 ug/h via INTRAVENOUS
  Filled 2024-05-24: qty 250

## 2024-05-24 MED ORDER — ETOMIDATE 2 MG/ML IV SOLN
INTRAVENOUS | Status: AC
  Administered 2024-05-24: 20 mg via INTRAVENOUS
  Filled 2024-05-24: qty 10

## 2024-05-24 MED ORDER — SODIUM CHLORIDE 0.9 % IV SOLN: 250.0000 mL | INTRAVENOUS | Status: AC

## 2024-05-24 MED ORDER — ETOMIDATE 2 MG/ML IV SOLN: 20.0000 mg | Freq: Once | INTRAVENOUS | Status: AC

## 2024-05-24 MED ORDER — LORAZEPAM 2 MG/ML IJ SOLN
2.0000 mg | Freq: Once | INTRAMUSCULAR | Status: AC
  Administered 2024-05-24: 2 mg via INTRAVENOUS
  Filled 2024-05-24: qty 1

## 2024-05-24 MED ORDER — LACTATED RINGERS IV BOLUS (SEPSIS)
1000.0000 mL | Freq: Once | INTRAVENOUS | Status: AC
  Administered 2024-05-24: 1000 mL via INTRAVENOUS

## 2024-05-24 MED ORDER — METRONIDAZOLE 500 MG/100ML IV SOLN
500.0000 mg | Freq: Once | INTRAVENOUS | Status: AC
  Administered 2024-05-24: 500 mg via INTRAVENOUS
  Filled 2024-05-24: qty 100

## 2024-05-24 MED ORDER — ROCURONIUM BROMIDE 10 MG/ML (PF) SYRINGE
PREFILLED_SYRINGE | INTRAVENOUS | Status: AC
  Administered 2024-05-24: 80 mg via INTRAVENOUS
  Filled 2024-05-24: qty 10

## 2024-05-24 NOTE — ED Provider Notes (Cosign Needed Addendum)
 [place] EMERGENCY DEPARTMENT AT [facility] Provider Note   CSN: [id number] Arrival date & time: 05/24/24  1708     Patient presents with: Code Sepsis   Revere Maahs is a 89 y.o. male past medical history significant for A-fib, hand deformity, hypertension, urinary retention, and hip fracture presents today for possible recurrent urosepsis.  Patient had a hip fracture in March of this year and since then has had an indwelling Foley catheter.  Patient has had recurrent urosepsis since having this Foley placed.  Per patient's family patient is also altered and has not been as active recently.  Per patient's family the patient has had 2 recent falls and has bruising on bilateral hips.  Per family patient is DNR but not DNI.  HPI     Prior to Admission medications   Medication Sig Start Date End Date Taking? Authorizing Provider  atorvastatin (LIPITOR) 40 MG tablet Take 40 mg by mouth daily.   Yes [provider]  cholecalciferol (VITAMIN D3) 25 MCG (1000 UNIT) tablet Take 1,000 Units by mouth daily.   Yes [provider]  diltiazem (CARDIZEM) 60 MG tablet Take 60 mg by mouth daily. 04/10/24  Yes [provider]  ELIQUIS 5 MG TABS tablet Take 5 mg by mouth 2 (two) times daily.   Yes [provider]  fenofibrate (TRICOR) 145 MG tablet Take 145 mg by mouth daily.   Yes [provider]  metoprolol succinate (TOPROL-XL) 25 MG 24 hr tablet Take 25 mg by mouth daily.   Yes [provider]  niacin (NIASPAN) 1000 MG CR tablet Take 1,000 mg by mouth at bedtime.   Yes [provider]  oxybutynin (DITROPAN) 5 MG tablet Take 5 mg by mouth 2 (two) times daily. 05/03/24  Yes [provider]  pantoprazole (PROTONIX) 40 MG tablet Take 40 mg by mouth daily.   Yes [provider]  tamsulosin (FLOMAX) 0.4 MG CAPS capsule Take 0.4 mg by mouth daily after supper.   Yes [provider]  cefUROXime (CEFTIN) 500 MG  tablet Take 500 mg by mouth 2 (two) times daily. Patient not taking: Reported on 05/25/2024 05/22/24   [provider]    Allergies: Patient has no known allergies.    Review of Systems  Constitutional:  Positive for activity change and fatigue.  Genitourinary:  Positive for hematuria.    Updated Vital Signs BP (!) 90/54   Pulse 95   Temp 97.6 F (36.4 C) (Axillary)   Resp 16   Ht 5' 8 (1.727 m)   Wt 74.6 kg   SpO2 98%   BMI 25.01 kg/m   Physical Exam Vitals and nursing note reviewed.  Constitutional:      General: He is in acute distress.     Appearance: He is ill-appearing.  HENT:     Head: Normocephalic and atraumatic.     Nose: Nose normal.   Eyes:     Extraocular Movements: Extraocular movements intact.    Cardiovascular:     Rate and Rhythm: Regular rhythm. Tachycardia present.     Pulses: Normal pulses.     Heart sounds: Normal heart sounds.  Pulmonary:     Breath sounds: Wheezing and rhonchi present.  Abdominal:     General: There is no distension.   Musculoskeletal:        General: No tenderness.     Cervical back: Normal range of motion.     Comments: Patient denies tenderness to palpation of  all 4 extremities or bilateral hips.  Patient with different stages of healing bruises on   Skin:    General: Skin is warm and dry.     Comments: Patient does have a sacral decubitus ulcer without signs of infection or purulent discharge on examination.   Neurological:     Mental Status: He is alert.   Psychiatric:        Mood and Affect: Mood normal.     (all labs ordered are listed, but only abnormal results are displayed) Labs Reviewed  COMPREHENSIVE METABOLIC PANEL WITH GFR - Abnormal; Notable for the following components:      Result Value   CO2 19 (*)    Glucose, Bld 181 (*)    Calcium 8.8 (*)    Total Protein 6.0 (*)    Albumin 2.6 (*)    AST 54 (*)    Total Bilirubin 1.8 (*)    All other components within normal limits  LACTIC  ACID, PLASMA - Abnormal; Notable for the following components:   Lactic Acid, Venous 2.9 (*)    All other components within normal limits  LACTIC ACID, PLASMA - Abnormal; Notable for the following components:   Lactic Acid, Venous 2.1 (*)    All other components within normal limits  CBC WITH DIFFERENTIAL/PLATELET - Abnormal; Notable for the following components:   WBC 12.4 (*)    RBC 4.01 (*)    Hemoglobin 12.0 (*)    HCT 37.3 (*)    RDW 17.4 (*)    Neutro Abs 10.1 (*)    All other components within normal limits  PROTIME-INR - Abnormal; Notable for the following components:   Prothrombin Time 29.5 (*)    INR 2.7 (*)    All other components within normal limits  URINALYSIS, W/ REFLEX TO CULTURE (INFECTION SUSPECTED) - Abnormal; Notable for the following components:   Color, Urine AMBER (*)    APPearance CLOUDY (*)    Glucose, UA 50 (*)    Hgb urine dipstick LARGE (*)    Protein, ur 100 (*)    Leukocytes,Ua TRACE (*)    All other components within normal limits  BLOOD GAS, ARTERIAL - Abnormal; Notable for the following components:   pH, Arterial 7.34 (*)    pO2, Arterial 157 (*)    Acid-base deficit 4.2 (*)    All other components within normal limits  BRAIN NATRIURETIC PEPTIDE - Abnormal; Notable for the following components:   B Natriuretic Peptide 479.0 (*)    All other components within normal limits  GLUCOSE, CAPILLARY - Abnormal; Notable for the following components:   Glucose-Capillary 129 (*)    All other components within normal limits  CBC - Abnormal; Notable for the following components:   WBC 11.0 (*)    RBC 3.68 (*)    Hemoglobin 10.8 (*)    HCT 34.2 (*)    RDW 17.0 (*)    All other components within normal limits  BASIC METABOLIC PANEL WITH GFR - Abnormal; Notable for the following components:   CO2 20 (*)    Glucose, Bld 101 (*)    Calcium 8.7 (*)    All other components within normal limits  MAGNESIUM - Abnormal; Notable for the following components:    Magnesium 1.4 (*)    All other components within normal limits  GLUCOSE, CAPILLARY - Abnormal; Notable for the following components:   Glucose-Capillary 111 (*)    All other components within normal limits  TROPONIN I (HIGH  SENSITIVITY) - Abnormal; Notable for the following components:   Troponin I (High Sensitivity) 23 (*)    All other components within normal limits  TROPONIN I (HIGH SENSITIVITY) - Abnormal; Notable for the following components:   Troponin I (High Sensitivity) 38 (*)    All other components within normal limits  CULTURE, BLOOD (ROUTINE X 2)  CULTURE, BLOOD (ROUTINE X 2)  RESP PANEL BY RT-PCR (RSV, FLU A&B, COVID)  RVPGX2  MRSA NEXT GEN BY PCR, NASAL  CULTURE, RESPIRATORY W GRAM STAIN  URINE CULTURE  RESPIRATORY PANEL BY PCR  PHOSPHORUS  STREP PNEUMONIAE URINARY ANTIGEN  PROCALCITONIN  LACTIC ACID, PLASMA  GLUCOSE, CAPILLARY  LEGIONELLA PNEUMOPHILA SEROGP 1 UR AG    EKG: EKG Interpretation Date/Time:  Monday May 24 2024 18:26:16 EDT Ventricular Rate:  110 PR Interval:    QRS Duration:  83 QT Interval:  323 QTC Calculation: 437 R Axis:   56  Text Interpretation: Atrial fibrillation with rapid ventricular response Low voltage Wandering baseline No STEMI No acute change when compared with prior EKG from 09/02/2023 Confirmed by Gennaro Bouchard ([id number]) on 05/24/2024 6:36:10 PM  Radiology: [facility] Chest Portable 1 View Result Date: 05/25/2024 CLINICAL DATA:  Verify OG tube placement. EXAM: PORTABLE CHEST 1 VIEW COMPARISON:  May 24, 2024 FINDINGS: Since the prior study there has been interval placement of an endotracheal tube with its distal tip approximately 2.2 cm from the carina. An orogastric tube is in place with its distal tip overlying the body of the stomach. The distal side hole sits at the expected level of the gastroesophageal junction. The cardiac silhouette is mildly enlarged and unchanged in size. Stable increased interstitial lung markings are noted,  most prominent throughout the left lung, right upper lobe and right lung base. Very small bilateral pleural effusions are seen. No pneumothorax is identified. Multilevel degenerative changes are present throughout the thoracic spine. IMPRESSION: 1. Interval placement of an endotracheal tube and orogastric tube, as described above. Advancement of the orogastric tube by approximately 5 cm is recommended to decrease the risk of aspiration. 2. Stable increased interstitial lung markings, most prominent throughout the left lung, right upper lobe and right lung base. 3. Very small bilateral pleural effusions. Electronically Signed   By: Suzen Dials M.D.   On: 05/25/2024 00:31   DG HIP UNILAT WITH PELVIS 2-3 VIEWS LEFT Result Date: 05/24/2024 CLINICAL DATA:  Pain. EXAM: DG HIP (WITH OR WITHOUT PELVIS) 2-3V LEFT; DG HIP (WITH OR WITHOUT PELVIS) 2-3V RIGHT COMPARISON:  None Available. FINDINGS: Right hip: Slight joint space narrowing with acetabular spurring. No fracture or dislocation. Pubic rami are intact. No erosive or bony destructive change. There is a globular focus of air projecting inferior to the right ischium in the soft tissues. Left hip: Hip hemiarthroplasty in expected alignment. No periprosthetic lucency. No acute or periprosthetic fracture. Pubic rami are intact. Vascular calcifications are seen. IMPRESSION: 1. Mild osteoarthritis of the right hip. 2. Left hip hemiarthroplasty without complication. 3. Focus of air projecting inferior to the right ischium in the soft tissues. This may be related to a decubitus ulcer, inguinal hernia or less likely soft tissue infection given morphology. Recommend correlation with physical exam. As clinically indicated, consider CT for further assessment. Electronically Signed   By: Andrea Gasman M.D.   On: 05/24/2024 19:20   DG HIP UNILAT WITH PELVIS 2-3 VIEWS RIGHT Result Date: 05/24/2024 CLINICAL DATA:  Pain. EXAM: DG HIP (WITH OR WITHOUT PELVIS) 2-3V LEFT; DG  HIP (WITH  OR WITHOUT PELVIS) 2-3V RIGHT COMPARISON:  None Available. FINDINGS: Right hip: Slight joint space narrowing with acetabular spurring. No fracture or dislocation. Pubic rami are intact. No erosive or bony destructive change. There is a globular focus of air projecting inferior to the right ischium in the soft tissues. Left hip: Hip hemiarthroplasty in expected alignment. No periprosthetic lucency. No acute or periprosthetic fracture. Pubic rami are intact. Vascular calcifications are seen. IMPRESSION: 1. Mild osteoarthritis of the right hip. 2. Left hip hemiarthroplasty without complication. 3. Focus of air projecting inferior to the right ischium in the soft tissues. This may be related to a decubitus ulcer, inguinal hernia or less likely soft tissue infection given morphology. Recommend correlation with physical exam. As clinically indicated, consider CT for further assessment. Electronically Signed   By: Andrea Gasman M.D.   On: 05/24/2024 19:20   CT Cervical Spine Wo Contrast Result Date: 05/24/2024 CLINICAL DATA:  Recurrent urosepsis. EXAM: CT CERVICAL SPINE WITHOUT CONTRAST TECHNIQUE: Multidetector CT imaging of the cervical spine was performed without intravenous contrast. Multiplanar CT image reconstructions were also generated. RADIATION DOSE REDUCTION: This exam was performed according to the departmental dose-optimization program which includes automated exposure control, adjustment of the mA and/or kV according to patient size and/or use of iterative reconstruction technique. COMPARISON:  None Available. FINDINGS: Alignment: Approximately 2 mm stepwise anterolisthesis is seen at the levels of C4-C5 and C5-C6. Skull base and vertebrae: No acute fracture. No primary bone lesion or focal pathologic process. Soft tissues and spinal canal: No prevertebral fluid or swelling. No visible canal hematoma. Disc levels: Marked severity endplate sclerosis is seen at the level of C6-C7, with mild to  moderate severity anterior osteophyte formation and posterior bony spurring noted at the levels of C4-C5, C5-C6 and C6-C7. There is marked severity narrowing of the anterior atlantoaxial articulation. Moderate severity intervertebral disc space narrowing is seen at C5-C6, with marked severity intervertebral disc space narrowing at C6-C7. Bilateral marked severity multilevel facet joint hypertrophy is noted. Upper chest: Marked severity biapical infiltrates are seen with large bilateral pleural effusions. Other: None. IMPRESSION: 1. No acute cervical spine fracture. 2. Marked severity multilevel degenerative changes, most prominent at the levels of C4-C5, C5-C6 and C6-C7. 3. Marked severity biapical infiltrates with large bilateral pleural effusions. Electronically Signed   By: Suzen Dials M.D.   On: 05/24/2024 19:18   DG Chest 2 View Result Date: 05/24/2024 CLINICAL DATA:  Provided history: Suspected Sepsis EXAM: CHEST - 2 VIEW COMPARISON:  01/28/2020 FINDINGS: Lung volumes are low. The heart is enlarged. Diffuse peribronchial and interstitial thickening throughout the left lung, right lung base and right upper lung zone. There are small bilateral pleural effusions. Biapical pleuroparenchymal scarring. No pneumothorax. IMPRESSION: 1. Diffuse peribronchial and interstitial thickening throughout the left lung, right lung base and right upper lung zone. Findings may represent atypical infection or pulmonary edema. 2. Small bilateral pleural effusions. 3. Cardiomegaly. Electronically Signed   By: Andrea Gasman M.D.   On: 05/24/2024 19:17   CT Head Wo Contrast Result Date: 05/24/2024 CLINICAL DATA:  Recurrent urosepsis. EXAM: CT HEAD WITHOUT CONTRAST TECHNIQUE: Contiguous axial images were obtained from the base of the skull through the vertex without intravenous contrast. RADIATION DOSE REDUCTION: This exam was performed according to the departmental dose-optimization program which includes automated  exposure control, adjustment of the mA and/or kV according to patient size and/or use of iterative reconstruction technique. COMPARISON:  None Available. FINDINGS: Brain: There is generalized cerebral atrophy with widening of  the extra-axial spaces and ventricular dilatation. There are areas of decreased attenuation within the white matter tracts of the supratentorial brain, consistent with microvascular disease changes. 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 Vascular: No hyperdense vessel or unexpected calcification. Skull: Normal. Negative for fracture or focal lesion. Sinuses/Orbits: No acute finding. Other: None. IMPRESSION: 1. Generalized cerebral atrophy and microvascular disease changes of the supratentorial brain. 2. No acute intracranial abnormality. Electronically Signed   By: Suzen Dials M.D.   On: 05/24/2024 19:13     Procedure Name: Intubation Date/Time: 05/24/2024 11:20 PM  Performed by: Francis Ileana SAILOR, PA-CPre-anesthesia Checklist: Patient identified, Suction available, Patient being monitored, Timeout performed and Emergency Drugs available Oxygen Delivery Method: Ambu bag Preoxygenation: Pre-oxygenation with 100% oxygen Induction Type: Rapid sequence Ventilation: Mask ventilation without difficulty Laryngoscope Size: Mac, 4 and Glidescope Tube size: 7.5 mm Number of attempts: 1 Airway Equipment and Method: Video-laryngoscopy Secured at: 24 cm Tube secured with: ETT holder    .Critical Care  Performed by: Francis Ileana SAILOR, PA-C Authorized by: Francis Ileana SAILOR, PA-C   Critical care provider statement:    Critical care time (minutes):  75   Critical care time was exclusive of:  Separately billable procedures and treating other patients   Critical care was necessary to treat or prevent imminent or life-threatening deterioration of the following conditions:  Respiratory failure and sepsis   Critical care was time spent personally  by me on the following activities:  Blood draw for specimens, development of treatment plan with patient or surrogate, discussions with consultants, evaluation of patient's response to treatment, examination of patient, interpretation of cardiac output measurements, obtaining history from patient or surrogate, ventilator management, review of old charts, re-evaluation of patient's condition, pulse oximetry, ordering and review of radiographic studies, ordering and review of laboratory studies and ordering and performing treatments and interventions   Care discussed with: admitting provider      Medications Ordered in the ED  0.9 %  sodium chloride  infusion (0 mLs Intravenous Hold 05/25/24 0000)  norepinephrine (LEVOPHED) 4mg  in (0.016 mg/mL) premix infusion (2 mcg/min Intravenous Infusion Verify 05/25/24 1000)  Oral care mouth rinse (15 mLs Mouth Rinse Given 05/25/24 1246)  Oral care mouth rinse (has no administration in time range)  Chlorhexidine Gluconate Cloth 2 % PADS 6 each (has no administration in time range)  heparin injection 5,000 Units (0 Units Subcutaneous Hold 05/25/24 0654)  pantoprazole (PROTONIX) injection 40 mg (has no administration in time range)  docusate (COLACE) 50 MG/5ML liquid 100 mg (has no administration in time range)  polyethylene glycol (MIRALAX / GLYCOLAX) packet 17 g (has no administration in time range)  docusate (COLACE) 50 MG/5ML liquid 100 mg (100 mg Per Tube Not Given 05/25/24 1027)  polyethylene glycol (MIRALAX / GLYCOLAX) packet 17 g (17 g Per Tube Not Given 05/25/24 1026)  fentaNYL  (SUBLIMAZE ) injection 25-50 mcg (0 mcg Intravenous Hold 05/25/24 0301)  fentaNYL  in NS (79mcg/ml) infusion-PREMIX (0 mcg/hr Intravenous Stopped 05/25/24 0901)  fentaNYL  (SUBLIMAZE ) bolus via infusion 25-100 mcg (has no administration in time range)  midazolam  (VERSED ) injection 1-2 mg (has no administration in time range)  atorvastatin (LIPITOR) tablet 20 mg (20 mg Per Tube  Given 05/25/24 1033)  cefTRIAXone (ROCEPHIN) 2 g in sodium chloride  0.9 % 100 mL IVPB (2 g Intravenous New Bag/Given 05/25/24 1033)  azithromycin (ZITHROMAX) 500 mg in sodium chloride  0.9 % 250 mL IVPB (500 mg Intravenous  New Bag/Given 05/25/24 1048)  arformoterol (BROVANA) nebulizer solution 15 mcg (has no administration in time range)  revefenacin (YUPELRI) nebulizer solution 175 mcg (has no administration in time range)  dexmedetomidine (PRECEDEX) 400 MCG/100ML (4 mcg/mL) infusion (has no administration in time range)  lactated ringers  bolus 1,000 mL (0 mLs Intravenous Stopped 05/24/24 1845)  ceFEPIme (MAXIPIME) 2 g in sodium chloride  0.9 % 100 mL IVPB (0 g Intravenous Stopped 05/24/24 1856)  metroNIDAZOLE (FLAGYL) IVPB 500 mg (0 mg Intravenous Stopped 05/24/24 2051)  vancomycin (VANCOCIN) IVPB 1000 mg/200 mL premix (0 mg Intravenous Stopped 05/24/24 2031)  LORazepam (ATIVAN) injection 1 mg (1 mg Intravenous Given 05/24/24 2149)  LORazepam (ATIVAN) injection 2 mg (2 mg Intravenous Given 05/24/24 2231)  etomidate (AMIDATE) injection 20 mg (20 mg Intravenous Given 05/24/24 2308)  rocuronium (ZEMURON) injection 80 mg (80 mg Intravenous Given 05/24/24 2311)  magnesium sulfate IVPB 2 g 50 mL (2 g Intravenous New Bag/Given 05/25/24 1251)    Clinical Course as of 05/25/24 1408  Mon May 24, 2024  2348 O2 Device: Ventilator [KK]    Clinical Course User Index [KK] Francis Ileana SAILOR, PA-C                                 Medical Decision Making Amount and/or Complexity of Data Reviewed Labs: ordered. Radiology: ordered.  Risk Prescription drug management. Decision regarding hospitalization.   This patient presents to the ED for concern of AMS and weakness, this involves an extensive number of treatment options, and is a complaint that carries with it a high risk of complications and morbidity.  The differential diagnosis includes sepsis, UTI, arrhythmia, pneumonia, COVID, flu, RSV,   Co morbidities /  Chronic conditions that complicate the patient evaluation  A-fib, hypertension,   Additional history obtained:  Additional history obtained from EMR External records from outside source obtained and reviewed including Care Everywhere   Lab Tests:  I Ordered, and personally interpreted labs.  The pertinent results include: Leukocytosis 12.4, anemia 12, elevated pro time and INR, mildly decreased CO2, mildly decreased calcium, decreased albumin at 2.6, elevated AST at 54 elevated total bili at 1.8, Lactic acid 2.9, respiratory panel negative   Imaging Studies ordered:  I ordered imaging studies including chest x-ray I independently visualized and interpreted imaging which showed diffuse peribronchial and interstitial thickening throughout the left lung, right lung base and right upper lung zone.  Small bilateral pleural effusions and cardiomegaly I agree with the radiologist interpretation CT head and C-spine Noncon: No acute cervical spine fracture.  No acute intracranial abnormality.  Upper chest with marked severity biapical infiltrates with large bilateral pleural effusions. Bilateral hip x-ray mild osteoarthritis of the right hip.  Left hip hemiarthroplasty without complication.  Focus of air projecting inferior to the right ischium in the soft tissues.   Cardiac Monitoring: / EKG:  The patient was maintained on a cardiac monitor.  I personally viewed and interpreted the cardiac monitored which showed an underlying rhythm of: A-fib with RVR   Problem List / ED Course / Critical interventions / Medication management  Code sepsis protocol I ordered medication including broad-spectrum antibiotics and IVF Reevaluation of the patient after these medicines showed that the patient stayed the same I have reviewed the patients home medicines and have made adjustments as needed  On reassessment patient grabbing at [facility] and satting in the 80s, patient switched to NRB and satting in low 90s  but continuing to grab at mask. RT was called and patient given two doses of ativan. Patient switched to BiPAP by RT and still agitated. Ultimately patient was sedated and intubated d/t agitation and not tolerating BiPAP.    Consultations Obtained:  I requested consultation with the intensivist, Dr. Layman,  and discussed lab and imaging findings as well as pertinent plan - they recommend: admission   Test / Admission - Considered:  Admit      Final diagnoses:  Sepsis, due to unspecified organism, unspecified whether acute organ dysfunction present [facility])  Acute respiratory failure with hypoxia [facility])    ED Discharge Orders     None          Francis Ileana SAILOR, PA-C 05/24/24 2353    Francis Ileana SAILOR, PA-C 05/25/24 1408    Kammerer, Megan L, DO 05/26/24 0004

## 2024-05-24 NOTE — Progress Notes (Signed)
 Rt Note: RT called due to pt desat on [place]. Pt placed on NIV/PC 6/6 50%. BBS coarse crackles, HR 120's, RR 27-35 SPO2 95%. Pt then transferred and intubated by ED provider. BBS =, + EtCO2 CXR pending. Pt placed on PRVC 540/16/60%/+8, tolerating well. ABG taken to lab.

## 2024-05-24 NOTE — Progress Notes (Signed)
 CODE SEPSIS - PHARMACY COMMUNICATION  **Broad Spectrum Antibiotics should be administered within 1 hour of Sepsis diagnosis**  Time Code Sepsis Called/Page Received: 1741  Antibiotics Ordered: vancomycin, cefepime, and metronidazole  Time of 1st antibiotic administration: 1822  Additional action taken by pharmacy: Messaged RN to get antibiotics started  If necessary, Name of Provider/Nurse Contacted: None    Damien Napoleon ,PharmD Clinical Pharmacist  05/24/2024  5:44 PM

## 2024-05-24 NOTE — ED Triage Notes (Signed)
 Pt arrived with family via POV c/o recurrent urosepsis, following a hip fracture in March this year. Pt presents with indwelling foley in Triage. Hematuria present in drainage bag.

## 2024-05-24 NOTE — ED Notes (Signed)
 Called to room by daughter who yelled for help, patient is agitated, unable to follow directions and is more labored in his breathing. Provider made aware. RT called for an assessment.

## 2024-05-25 ENCOUNTER — Inpatient Hospital Stay (HOSPITAL_COMMUNITY)

## 2024-05-25 DIAGNOSIS — G9341 Metabolic encephalopathy: Secondary | ICD-10-CM

## 2024-05-25 DIAGNOSIS — A419 Sepsis, unspecified organism: Secondary | ICD-10-CM

## 2024-05-25 DIAGNOSIS — E8729 Other acidosis: Secondary | ICD-10-CM

## 2024-05-25 DIAGNOSIS — L8990 Pressure ulcer of unspecified site, unspecified stage: Secondary | ICD-10-CM | POA: Insufficient documentation

## 2024-05-25 DIAGNOSIS — J9601 Acute respiratory failure with hypoxia: Secondary | ICD-10-CM | POA: Diagnosis not present

## 2024-05-25 DIAGNOSIS — R578 Other shock: Secondary | ICD-10-CM | POA: Diagnosis not present

## 2024-05-25 LAB — BASIC METABOLIC PANEL WITH GFR
Anion gap: 10 (ref 5–15)
BUN: 14 mg/dL (ref 8–23)
CO2: 20 mmol/L — ABNORMAL LOW (ref 22–32)
Calcium: 8.7 mg/dL — ABNORMAL LOW (ref 8.9–10.3)
Chloride: 110 mmol/L (ref 98–111)
Creatinine, Ser: 0.79 mg/dL (ref 0.61–1.24)
GFR, Estimated: >60 mL/min (ref >60)
Glucose, Bld: 101 mg/dL — ABNORMAL HIGH (ref 70–99)
Potassium: 4 mmol/L (ref 3.5–5.1)
Sodium: 140 mmol/L (ref 135–145)

## 2024-05-25 LAB — GLUCOSE, CAPILLARY
Glucose-Capillary: 129 mg/dL — ABNORMAL HIGH (ref 70–99)
Glucose-Capillary: 111 mg/dL — ABNORMAL HIGH (ref 70–99)
Glucose-Capillary: 81 mg/dL (ref 70–99)
Glucose-Capillary: 75 mg/dL (ref 70–99)
Glucose-Capillary: 90 mg/dL (ref 70–99)
Glucose-Capillary: 85 mg/dL (ref 70–99)

## 2024-05-25 LAB — ECHOCARDIOGRAM COMPLETE
Calc EF: 38.7 %
Height: 68 in
S' Lateral: 3.45 cm
Single Plane A2C EF: 39.6 %
Single Plane A4C EF: 30.2 %
Weight: 2631.41 [oz_av]

## 2024-05-25 LAB — CBC
HCT: 34.2 % — ABNORMAL LOW (ref 39.0–52.0)
Hemoglobin: 10.8 g/dL — ABNORMAL LOW (ref 13.0–17.0)
MCH: 29.3 pg (ref 26.0–34.0)
MCHC: 31.6 g/dL (ref 30.0–36.0)
MCV: 92.9 fL (ref 80.0–100.0)
Platelets: 213 10*3/uL (ref 150–400)
RBC: 3.68 MIL/uL — ABNORMAL LOW (ref 4.22–5.81)
RDW: 17 % — ABNORMAL HIGH (ref 11.5–15.5)
WBC: 11 10*3/uL — ABNORMAL HIGH (ref 4.0–10.5)
nRBC: 0 % (ref 0.0–0.2)

## 2024-05-25 LAB — STREP PNEUMONIAE URINARY ANTIGEN: Strep Pneumo Urinary Antigen: NEGATIVE

## 2024-05-25 LAB — LACTIC ACID, PLASMA: Lactic Acid, Venous: 1.9 mmol/L (ref 0.5–1.9)

## 2024-05-25 LAB — MRSA NEXT GEN BY PCR, NASAL: MRSA by PCR Next Gen: NOT DETECTED

## 2024-05-25 LAB — TROPONIN I (HIGH SENSITIVITY): Troponin I (High Sensitivity): 38 ng/L — ABNORMAL HIGH (ref <18)

## 2024-05-25 LAB — MAGNESIUM: Magnesium: 1.4 mg/dL — ABNORMAL LOW (ref 1.7–2.4)

## 2024-05-25 LAB — PHOSPHORUS: Phosphorus: 2.8 mg/dL (ref 2.5–4.6)

## 2024-05-25 LAB — PROCALCITONIN: Procalcitonin: <0.1 ng/mL

## 2024-05-25 MED ORDER — METOPROLOL TARTRATE 5 MG/5ML IV SOLN
5.0000 mg | Freq: Once | INTRAVENOUS | Status: AC
  Administered 2024-05-25: 5 mg via INTRAVENOUS
  Filled 2024-05-25: qty 5

## 2024-05-25 MED ORDER — DOCUSATE SODIUM 50 MG/5ML PO LIQD
100.0000 mg | Freq: Two times a day (BID) | ORAL | Status: DC
  Administered 2024-05-25 – 2024-05-27 (×4): 100 mg
  Filled 2024-05-25 (×5): qty 10

## 2024-05-25 MED ORDER — FENTANYL 2500MCG IN NS 250ML (10MCG/ML) PREMIX INFUSION
0.0000 ug/h | INTRAVENOUS | Status: DC
  Administered 2024-05-25: 25 ug/h via INTRAVENOUS

## 2024-05-25 MED ORDER — POLYETHYLENE GLYCOL 3350 17 G PO PACK
17.0000 g | PACK | Freq: Every day | ORAL | Status: DC
Start: 2024-05-25 — End: 2024-05-27
  Administered 2024-05-26 – 2024-05-27 (×2): 17 g
  Filled 2024-05-25 (×3): qty 1

## 2024-05-25 MED ORDER — DEXMEDETOMIDINE HCL IN NACL 400 MCG/100ML IV SOLN
0.0000 ug/kg/h | INTRAVENOUS | Status: DC
  Administered 2024-05-25: 0.4 ug/kg/h via INTRAVENOUS
  Administered 2024-05-26: 0.3 ug/kg/h via INTRAVENOUS
  Filled 2024-05-25 (×2): qty 100

## 2024-05-25 MED ORDER — DOCUSATE SODIUM 50 MG/5ML PO LIQD: 100.0000 mg | Freq: Two times a day (BID) | ORAL | Status: DC | PRN

## 2024-05-25 MED ORDER — HEPARIN SODIUM (PORCINE) 5000 UNIT/ML IJ SOLN
5000.0000 [IU] | Freq: Three times a day (TID) | INTRAMUSCULAR | Status: DC
  Administered 2024-05-25 – 2024-05-26 (×3): 5000 [IU] via SUBCUTANEOUS
  Filled 2024-05-25 (×3): qty 1

## 2024-05-25 MED ORDER — VANCOMYCIN HCL 1250 MG/250ML IV SOLN: 1250.0000 mg | INTRAVENOUS | Status: DC

## 2024-05-25 MED ORDER — SODIUM CHLORIDE 0.9 % IV SOLN
2.0000 g | INTRAVENOUS | Status: DC
  Administered 2024-05-25 – 2024-05-30 (×6): 2 g via INTRAVENOUS
  Filled 2024-05-25 (×6): qty 20

## 2024-05-25 MED ORDER — ARFORMOTEROL TARTRATE 15 MCG/2ML IN NEBU
15.0000 ug | INHALATION_SOLUTION | Freq: Two times a day (BID) | RESPIRATORY_TRACT | Status: DC
  Administered 2024-05-25 – 2024-06-01 (×14): 15 ug via RESPIRATORY_TRACT
  Filled 2024-05-25 (×14): qty 2

## 2024-05-25 MED ORDER — SODIUM CHLORIDE 0.9 % IV SOLN
500.0000 mg | INTRAVENOUS | Status: AC
  Administered 2024-05-25 – 2024-05-26 (×2): 500 mg via INTRAVENOUS
  Filled 2024-05-25 (×2): qty 5

## 2024-05-25 MED ORDER — FENTANYL CITRATE PF 50 MCG/ML IJ SOSY: 25.0000 ug | PREFILLED_SYRINGE | Freq: Once | INTRAMUSCULAR | Status: DC

## 2024-05-25 MED ORDER — ORAL CARE MOUTH RINSE: 15.0000 mL | OROMUCOSAL | Status: DC | PRN

## 2024-05-25 MED ORDER — PIPERACILLIN-TAZOBACTAM 3.375 G IVPB
3.3750 g | Freq: Three times a day (TID) | INTRAVENOUS | Status: DC
  Administered 2024-05-25: 3.375 g via INTRAVENOUS
  Filled 2024-05-25: qty 50

## 2024-05-25 MED ORDER — MIDAZOLAM HCL 2 MG/2ML IJ SOLN: 1.0000 mg | INTRAMUSCULAR | Status: DC | PRN

## 2024-05-25 MED ORDER — ATORVASTATIN CALCIUM 10 MG PO TABS
20.0000 mg | ORAL_TABLET | Freq: Every day | ORAL | Status: DC
  Administered 2024-05-25 – 2024-05-27 (×3): 20 mg
  Filled 2024-05-25 (×3): qty 2

## 2024-05-25 MED ORDER — FENTANYL BOLUS VIA INFUSION: 25.0000 ug | INTRAVENOUS | Status: DC | PRN

## 2024-05-25 MED ORDER — ORAL CARE MOUTH RINSE
15.0000 mL | OROMUCOSAL | Status: DC
  Administered 2024-05-25 – 2024-05-27 (×28): 15 mL via OROMUCOSAL

## 2024-05-25 MED ORDER — POLYETHYLENE GLYCOL 3350 17 G PO PACK: 17.0000 g | PACK | Freq: Every day | ORAL | Status: DC | PRN

## 2024-05-25 MED ORDER — CHLORHEXIDINE GLUCONATE CLOTH 2 % EX PADS
6.0000 | MEDICATED_PAD | Freq: Every day | CUTANEOUS | Status: DC
  Administered 2024-05-25 – 2024-06-01 (×7): 6 via TOPICAL

## 2024-05-25 MED ORDER — FENTANYL BOLUS VIA INFUSION: 30.0000 ug | INTRAVENOUS | Status: DC | PRN

## 2024-05-25 MED ORDER — REVEFENACIN 175 MCG/3ML IN SOLN
175.0000 ug | Freq: Every day | RESPIRATORY_TRACT | Status: DC
  Administered 2024-05-26 – 2024-06-01 (×7): 175 ug via RESPIRATORY_TRACT
  Filled 2024-05-25 (×7): qty 3

## 2024-05-25 MED ORDER — PANTOPRAZOLE SODIUM 40 MG IV SOLR
40.0000 mg | Freq: Every day | INTRAVENOUS | Status: DC
Start: 2024-05-25 — End: 2024-05-28
  Administered 2024-05-25 – 2024-05-27 (×3): 40 mg via INTRAVENOUS
  Filled 2024-05-25 (×3): qty 10

## 2024-05-25 MED ORDER — MAGNESIUM SULFATE 2 GM/50ML IV SOLN
2.0000 g | Freq: Once | INTRAVENOUS | Status: AC
  Administered 2024-05-25: 2 g via INTRAVENOUS
  Filled 2024-05-25: qty 50

## 2024-05-25 NOTE — Progress Notes (Signed)
 90ml Midazolam  wasted. Witnessed by Leonor Hoover, RN.

## 2024-05-25 NOTE — ED Notes (Signed)
 Portable xray at bedside.

## 2024-05-25 NOTE — Progress Notes (Signed)
 Wasted Fentanyl  with Harlene Burton, RN

## 2024-05-25 NOTE — Progress Notes (Incomplete)
 Initial Nutrition Assessment  DOCUMENTATION CODES:  Not applicable  INTERVENTION:  Initiate tube feeding via OGT: Osmolite 1.5 at 45 ml/h (1080 ml per day) Start at 25 and advance by 10mL q12h to goal of 45 Prosource TF20 60 ml 1x/d Provides 1700 kcal, 88 gm protein, 823 ml free water daily   NUTRITION DIAGNOSIS:  Inadequate oral intake related to inability to eat as evidenced by NPO status.  GOAL:  Patient will meet greater than or equal to 90% of their needs  MONITOR:  TF tolerance, Vent status, Labs, I & O's  REASON FOR ASSESSMENT:  Ventilator    ASSESSMENT:  Pt with hx of HTN, HLD, GERD, atrial fibrillation, and recent hip fracture (01/2024) presented to ED with AMS and weakness. Found to be septic on admission.  6/30 - presented to ED, intubated   Patient is currently intubated on ventilator support. Pt discussed during ICU rounds and with RN and MD. Feeds requested to be held as extubation will be attempted. Noted CBGs low/lower end of normal overnight. Will leave recommendations to be utilized in the event that pt is unable to be extubated.   MV: 8.6 L/min Temp (24hrs), Avg:98.3 F (36.8 C), Min:97.6 F (36.4 C), Max:100.5 F (38.1 C) MAP (cuff):  Admit weight: 85 kg ? accuracy  Current weight: 76.8 kg    Intake/Output Summary (Last 24 hours) at 05/26/2024 0933 Last data filed at 05/26/2024 0700 Gross per 24 hour  Intake 630.38 ml  Output 610 ml  Net 20.38 ml  Net IO Since Admission: 3,104.1 mL [05/26/24 0933]  Drains/Lines: OGT 18 Fr. UOP 550 mL x 24 hours  Nutritionally Relevant Medications: Scheduled Meds:  atorvastatin  20 mg Per Tube Daily   docusate  100 mg Per Tube BID   pantoprazole (PROTONIX) IV  40 mg Intravenous QHS   polyethylene glycol  17 g Per Tube Daily   Continuous Infusions:  azithromycin Stopped (05/25/24 1148)   cefTRIAXone (ROCEPHIN)  IV Stopped (05/25/24 1103)   norepinephrine (LEVOPHED) Adult infusion 1 mcg/min  (05/26/24 0700)   potassium chloride     PRN Meds: docusate, polyethylene glycol   Labs Reviewed: Chloride 112 Magnesium 1.5 CBG ranges from 64-111 mg/dL over the last 24 hours  NUTRITION - FOCUSED PHYSICAL EXAM: Defer to in-person assessment  Diet Order:   Diet Order             Diet NPO time specified  Diet effective now                   EDUCATION NEEDS:  Not appropriate for education at this time  Skin:  Skin Assessment: Reviewed RN Assessment Stage 2: Medial Sacrum (1x1 cm)  Last BM:  6/30  Height:  Ht Readings from Last 1 Encounters:  05/24/24 5' 8 (1.727 m)    Weight:  Wt Readings from Last 1 Encounters:  05/26/24 76.8 kg    Ideal Body Weight:  70 kg  BMI:  Body mass index is 25.74 kg/m.  Estimated Nutritional Needs:  Kcal:  1500-1700 kcal/d Protein:  75-90g/d Fluid:  >/=1.5L/d    Vernell Lukes, RD, LDN, CNSC Registered Dietitian II Please reach out via secure chat

## 2024-05-25 NOTE — TOC CM/SW Note (Signed)
 Transition of Care [facility]) - Inpatient Brief Assessment   Patient Details  Name: Anthony Fields MRN: [id number] Date of Birth: 09-12-1932  Transition of Care [facility]) CM/SW Contact:    Lauraine FORBES Saa, LCSW Phone Number: 05/25/2024, 10:18 AM   Clinical Narrative:  10:18 AM Per chart review, patient is currently intubated and unable to answer SDOH questions. Patient has a PCP and insurance. Patient does not have SNF or DME history. Patient has HH history with [facility]. Patient's preferred pharmacy's are [facility] [address], [address] [facility] [phone] [place], and [facility]. No TOC needs were identified at this time. TOC will continue to follow and be available to assist.  Transition of Care Asessment: Insurance and Status: Insurance coverage has been reviewed Patient has primary care physician: Yes Home environment has been reviewed: Private Residence Prior level of function:: N/A Prior/Current Home Services: No current home services (Has home health history) Social Drivers of Health Review:  (Patient unable to answer) Readmission risk has been reviewed: Yes Transition of care needs: no transition of care needs at this time

## 2024-05-25 NOTE — H&P (Signed)
 NAME:  Anthony Fields, MRN:  [id number], DOB:  January 22, 1932, LOS: 1 ADMISSION DATE:  05/24/2024, CONSULTATION DATE:  05/25/24 REFERRING MD:  [facility] ED  CHIEF COMPLAINT:  Urosepsis   History of Present Illness:  Pt is encephelopathic; therefore, this HPI is obtained from chart review. Anthony Fields is a 89 y.o. male who has a PMH as outlined below including recurrent urosepsis.  He had a hip fracture in March 2025 s/p surgical repair and ever since then, he has an indwelling Foley catheter.  Since having the Foley placed, he has had recurrent bouts of urosepsis.  On 7/1, family reported that he had been altered and not acting his usual self.  He was subsequently taken [facility] for further evaluation and management.  While in the ED, he was noted to have desaturations and increased work of breathing.  He was initially placed on an IV but did not tolerate and subsequently required intubation.  His chronic Foley was replaced in the ED and UA showed only trace leukocytes with negative nitrites and no bacteria.  His initial lactate was mildly up at 2.9 and he was mildly hypotensive and was subsequently started on low-dose norepinephrine.  He was later transferred to [facility] ICU for further evaluation and management.  Pertinent  Medical History:  has Generalized abdominal pain and Pneumonia on their problem list.  Significant Hospital Events: Including procedures, antibiotic start and stop dates in addition to other pertinent events   7/1 admit.  Interim History / Subjective:  Sedated on fentanyl  infusion and midazolam  infusion.  Nonresponsive.  On 4 mcg/min norepinephrine.  Objective:  Blood pressure 102/62, pulse (!) 108, temperature 97.7 F (36.5 C), temperature source Axillary, resp. rate (!) 25, height 5' 8 (1.727 m), weight 74.6 kg, SpO2 97%.    Vent Mode: CPAP FiO2 (%):  [50 %-60 %] 60 % Set Rate:  [14 bmp-16 bmp] 16 bmp Vt Set:  [540 mL] 540 mL PEEP:  [6 cmH20-8 cmH20] 8 cmH20 Plateau  Pressure:  [19 cmH20-20 cmH20] 19 cmH20   Intake/Output Summary (Last 24 hours) at 05/25/2024 0306 Last data filed at 05/25/2024 0200 Gross per 24 hour  Intake 2288.91 ml  Output --  Net 2288.91 ml   Filed Weights   05/24/24 1726 05/25/24 0200  Weight: 85 kg 74.6 kg     Physical Exam: General: Elderly male, resting in bed, in NAD. Neuro: Sedated, not responsive. HEENT: [place]/AT. Sclerae anicteric. ETT in place. Cardiovascular: IRIR, no M/R/G.  Lungs: Respirations even and unlabored.  Coarse bilaterally. Abdomen: BS x 4, soft, NT/ND.  Musculoskeletal: Right hand congenital deformity. No peripheral edema.    Labs/imaging personally reviewed:    Assessment & Plan:   Acute hypoxic respiratory failure - unclear etiology, possible pneumonia. - Continue Full vent support. - Daily weaning as able. - Empiric Vanco/Zosyn for now. - Check urine strep, urine Legionella. - Follow Blood cultures. - Add on trach aspirate. - Get PCT. - CXR intermittently.  Sepsis with possible shock physiology - hypotension could be in part 2/2 sedation. - Continue norepinephrine, goal MAP >65. - D/c Midazolam  gtt. - Get echo. - Supportive care as above, follow blood cultures, urine culture, trach aspirate.  Hx PAF (not on anticoagulation, appears to have first been diagnosed October 2024), HTN, HLD. - Continue PTA atorvastatin. - Hold PTA amlodipine, losartan, fenofibrate, niacin.  Lactic acidosis. - Trend lactate. - Follow BMP.   Best practice (evaluated daily):  Diet/type: NPO DVT prophylaxis: prophylactic heparin  Pressure  ulcer(s): present on admission  - see RN notes, per RN stage 2 buttocks. GI prophylaxis: PPI Lines: N/A Foley:  Yes, and it is still needed - changed in ED Code Status:  full code - default, no family available currently Last date of multidisciplinary goals of care discussion: None yet.  Labs   CBC: Recent Labs  Lab 05/24/24 1738  WBC 12.4*  NEUTROABS 10.1*  HGB  12.0*  HCT 37.3*  MCV 93.0  PLT 256    Basic Metabolic Panel: Recent Labs  Lab 05/24/24 1738  NA 137  K 3.8  CL 107  CO2 19*  GLUCOSE 181*  BUN 14  CREATININE 0.93  CALCIUM 8.8*   GFR: Estimated Creatinine Clearance: 51.1 mL/min (by C-G formula based on SCr of 0.93 mg/dL). Recent Labs  Lab 05/24/24 1738 05/24/24 1914  WBC 12.4*  --   LATICACIDVEN 2.9* 2.1*    Liver Function Tests: Recent Labs  Lab 05/24/24 1738  AST 54*  ALT 37  ALKPHOS 71  BILITOT 1.8*  PROT 6.0*  ALBUMIN 2.6*   No results for input(s): LIPASE, AMYLASE in the last 168 hours. No results for input(s): AMMONIA in the last 168 hours.  ABG    Component Value Date/Time   PHART 7.34 (L) 05/24/2024 2337   PCO2ART 39 05/24/2024 2337   PO2ART 157 (H) 05/24/2024 2337   HCO3 21.0 05/24/2024 2337   ACIDBASEDEF 4.2 (H) 05/24/2024 2337   O2SAT 99.4 05/24/2024 2337     Coagulation Profile: Recent Labs  Lab 05/24/24 1738  INR 2.7*    Cardiac Enzymes: No results for input(s): CKTOTAL, CKMB, CKMBINDEX, TROPONINI in the last 168 hours.  HbA1C: No results found for: HGBA1C  CBG: Recent Labs  Lab 05/25/24 0214  GLUCAP 129*    Review of Systems:   Unable to obtain as pt is encephalopathic.  Past Medical History:  He,  has a past medical history of Acid reflux, Atrial fibrillation (HCC), Hand deformity, congenital, High cholesterol, and Hypertension.   Surgical History:   Past Surgical History:  Procedure Laterality Date   CARDIAC CATHETERIZATION     CORONARY ANGIOPLASTY WITH STENT PLACEMENT     HIP ARTHROSCOPY Left    March 2025   I & D EXTREMITY  11/14/2012   Procedure: IRRIGATION AND DEBRIDEMENT EXTREMITY;  Surgeon: Balinda JAYSON Rogue, MD;  Location: [facility] OR;  Service: Plastics;  Laterality: Right;     Social History:   reports that he has quit smoking. He does not have any smokeless tobacco history on file. He reports current alcohol use. He reports that he does not  use drugs.   Family History:  His family history is not on file.   Allergies No Known Allergies   Home Medications  Prior to Admission medications   Medication Sig Start Date End Date Taking? Authorizing Provider  amLODipine (NORVASC) 5 MG tablet Take 5 mg by mouth daily.    [provider]  atorvastatin (LIPITOR) 20 MG tablet Take 20 mg by mouth daily.    [provider]  fenofibrate (TRICOR) 145 MG tablet Take 145 mg by mouth daily.    [provider]  losartan (COZAAR) 100 MG tablet Take 100 mg by mouth daily.    [provider]  niacin (NIASPAN) 1000 MG CR tablet Take 1,000 mg by mouth at bedtime.    [provider]  pantoprazole (PROTONIX) 40 MG tablet Take 40 mg by mouth daily.    [provider]  Critical care time: 35 min.   Sammi Gore, PA - [facility] For pager details, please see AMION or use Epic chat  After 1900, please call ELINK for cross coverage needs 05/25/2024, 3:06 AM

## 2024-05-25 NOTE — Plan of Care (Signed)
  Problem: Clinical Measurements: Goal: Respiratory complications will improve Outcome: Progressing   Problem: Activity: Goal: Risk for activity intolerance will decrease Outcome: Progressing   Problem: Coping: Goal: Level of anxiety will decrease Outcome: Progressing   Problem: Safety: Goal: Ability to remain free from injury will improve Outcome: Progressing   Problem: Skin Integrity: Goal: Risk for impaired skin integrity will decrease Outcome: Progressing

## 2024-05-25 NOTE — Progress Notes (Signed)
 Echocardiogram 2D Echocardiogram has been performed.  Anthony Fields RDCS 05/25/2024, 10:48 AM

## 2024-05-25 NOTE — Progress Notes (Signed)
 NAME:  Addam Goeller, MRN:  [id number], DOB:  [birth year], LOS: 1 ADMISSION DATE:  05/24/2024, CONSULTATION DATE:  05/25/24 REFERRING MD:  [facility] ED  CHIEF COMPLAINT:  Urosepsis   History of Present Illness:  Pt is encephelopathic; therefore, this HPI is obtained from chart review. Anthony Fields is a 89 y.o. male who has a PMH as outlined below including recurrent urosepsis.  He had a hip fracture in March 2025 s/p surgical repair and ever since then, he has an indwelling Foley catheter.  Since having the Foley placed, he has had recurrent bouts of urosepsis.  On 7/1, family reported that he had been altered and not acting his usual self.  He was subsequently taken [facility] for further evaluation and management.   While in the ED, he was noted to have desaturations and increased work of breathing.  He was initially placed on an IV but did not tolerate and subsequently required intubation.   His chronic Foley was replaced in the ED and UA showed only trace leukocytes with negative nitrites and no bacteria.  His initial lactate was mildly up at 2.9 and he was mildly hypotensive and was subsequently started on low-dose norepinephrine.   He was later transferred to [facility] ICU for further evaluation and management.  Pertinent  Medical History  Hip Fx March 2025 with subsequent chronic foley and recurrent UTIs, HTN, HLD, GERD  Significant Hospital Events: Including procedures, antibiotic start and stop dates in addition to other pertinent events   7/1 admission, intubation  Interim History / Subjective:  Admitted overnight. Remains intubated. Norepi reduced from 6 to 2 mcg  Objective    Blood pressure (!) 90/54, pulse 95, temperature 97.8 F (36.6 C), temperature source Axillary, resp. rate 16, height 5' 8 (1.727 m), weight 74.6 kg, SpO2 98%.    Vent Mode: PRVC FiO2 (%):  [40 %-60 %] 40 % Set Rate:  [14 bmp-16 bmp] 16 bmp Vt Set:  [540 mL] 540 mL PEEP:  [6 cmH20-8 cmH20] 8 cmH20 Plateau  Pressure:  [14 cmH20-20 cmH20] 14 cmH20   Intake/Output Summary (Last 24 hours) at 05/25/2024 1101 Last data filed at 05/25/2024 1000 Gross per 24 hour  Intake 3099.6 ml  Output --  Net 3099.6 ml   Filed Weights   05/24/24 1726 05/25/24 0200  Weight: 85 kg 74.6 kg    Examination: General: Elderly male sedated and intubated in bed Neuro: Sedated, not responsive. HEENT: [place]/AT. Sclerae anicteric. ETT in place. Cardiovascular: RRR no MRG.  Lungs: Respirations even and unlabored.  Coarse bilaterally. Abdomen: BS x 4, soft, NT/ND.  Musculoskeletal: Right hand congenital deformity. No peripheral edema.   Resolved problem list  Lactic acidosis  Assessment and Plan   Acute hypoxic respiratory failure - treating CAP Sepsis with Shock vs hypotension secondary to sedation Intubated at presentation and required levophed that is since weaned to 2mcg. Started empiric antibiotics overnight.  Will continue to treat as a respiratory infection with white count, CXR with pulmonary edema, atypical pneumonia, pleural effusion, but will keep an eye on urine cultures given his recurrent UTIs (although UA without bacteria). Tracheal aspirate no organisms. Flu/covid/rsv negative. Strep urine ag negative. MRSA negative. Bcx no growth at 12 hours. BNP is elevated but with improvement, this could be illness-mediated, but have an echo to evaluate. - Deescalate ABX to ctx/zithromax for cap coverage - Wean vent and sedation as able, may attempt SBT later today - Wean norepi as able for Maps > 65 -  Urine Legionella pending - Procal pending - Echo pending - Follow Blood cultures - get extended RVP   Hypomagnesemia 1.4 this am. Will replace.  Hx Recurrent UTIs Catheter replaced at admission. UA without bacteria. - follow urine culture - Abx per above  Hx PAF  In NSR this morning. Recently started Eliquis, diltiazem, toprol. For now will continue with subcutaneous heparin.  HTN, HLD - Continue PTA  atorvastatin. - Hold PTA amlodipine, losartan, fenofibrate, niacin.   Best Practice (right click and Reselect all SmartList Selections daily)   Diet/type: NPO DVT prophylaxis: prophylactic heparin  Pressure ulcer(s): present on admission  - see RN notes, per RN stage 2 buttocks. GI prophylaxis: PPI Lines: N/A Foley:  Yes, and it is still needed - changed in ED Code Status:  full code - default, no family available currently Last date of multidisciplinary goals of care discussion: None yet.  Labs   CBC: Recent Labs  Lab 05/24/24 1738 05/25/24 0944  WBC 12.4* 11.0*  NEUTROABS 10.1*  --   HGB 12.0* 10.8*  HCT 37.3* 34.2*  MCV 93.0 92.9  PLT 256 213    Basic Metabolic Panel: Recent Labs  Lab 05/24/24 1738  NA 137  K 3.8  CL 107  CO2 19*  GLUCOSE 181*  BUN 14  CREATININE 0.93  CALCIUM 8.8*   GFR: Estimated Creatinine Clearance: 51.1 mL/min (by C-G formula based on SCr of 0.93 mg/dL). Recent Labs  Lab 05/24/24 1738 05/24/24 1914 05/25/24 0944  WBC 12.4*  --  11.0*  LATICACIDVEN 2.9* 2.1* 1.9    Liver Function Tests: Recent Labs  Lab 05/24/24 1738  AST 54*  ALT 37  ALKPHOS 71  BILITOT 1.8*  PROT 6.0*  ALBUMIN 2.6*   No results for input(s): LIPASE, AMYLASE in the last 168 hours. No results for input(s): AMMONIA in the last 168 hours.  ABG    Component Value Date/Time   PHART 7.34 (L) 05/24/2024 2337   PCO2ART 39 05/24/2024 2337   PO2ART 157 (H) 05/24/2024 2337   HCO3 21.0 05/24/2024 2337   ACIDBASEDEF 4.2 (H) 05/24/2024 2337   O2SAT 99.4 05/24/2024 2337     Coagulation Profile: Recent Labs  Lab 05/24/24 1738  INR 2.7*    Cardiac Enzymes: No results for input(s): CKTOTAL, CKMB, CKMBINDEX, TROPONINI in the last 168 hours.  HbA1C: No results found for: HGBA1C  CBG: Recent Labs  Lab 05/25/24 0214 05/25/24 0733  GLUCAP 129* 111*    Review of Systems:   Sedated patient. Negative per above.  Past Medical History:   He,  has a past medical history of Acid reflux, Atrial fibrillation (HCC), Hand deformity, congenital, High cholesterol, and Hypertension.   Surgical History:   Past Surgical History:  Procedure Laterality Date   CARDIAC CATHETERIZATION     CORONARY ANGIOPLASTY WITH STENT PLACEMENT     HIP ARTHROSCOPY Left    March 2025   I & D EXTREMITY  11/14/2012   Procedure: IRRIGATION AND DEBRIDEMENT EXTREMITY;  Surgeon: Balinda JAYSON Rogue, MD;  Location: [facility] OR;  Service: Plastics;  Laterality: Right;     Social History:   reports that he has quit smoking. He does not have any smokeless tobacco history on file. He reports current alcohol use. He reports that he does not use drugs.   Family History:  His family history is not on file.   Allergies No Known Allergies   Home Medications  Prior to Admission medications   Medication Sig Start Date End  Date Taking? Authorizing Provider  amLODipine (NORVASC) 5 MG tablet Take 5 mg by mouth daily.    [provider]  atorvastatin (LIPITOR) 20 MG tablet Take 20 mg by mouth daily.    [provider]  fenofibrate (TRICOR) 145 MG tablet Take 145 mg by mouth daily.    [provider]  losartan (COZAAR) 100 MG tablet Take 100 mg by mouth daily.    [provider]  niacin (NIASPAN) 1000 MG CR tablet Take 1,000 mg by mouth at bedtime.    [provider]  pantoprazole (PROTONIX) 40 MG tablet Take 40 mg by mouth daily.    [provider]     Critical care time: 35 mins    Lonni Africa, DO IM Resident PGY-2

## 2024-05-25 NOTE — Progress Notes (Signed)
 eLink Physician-Brief Progress Note Patient Name: Alexzavier Girardin DOB: [birth year] MRN: [id number]   Date of Service  05/25/2024  HPI/Events of Note  89-year-old male with a recent hip fracture in March 2025 status post surgical repair with chronic indwelling Foley in place that presents to the ICU for management of acute hypoxic respiratory failure in the setting of septic shock from pneumonia and likely urosepsis.  Intubated.  On examination, he is tachypneic, tachycardic, saturating 96% on 60% FiO2.  Results show adequate ventilation and oxygenation, mild electrolyte disturbances, resolving lactic acidosis and leukocytosis.  Radiographic findings reviewed.  eICU Interventions  Maintain mechanical ventilation, daily spontaneous awakening/breathing trials  Maintain norepinephrine as needed to keep MAP greater than 65.  LR infusion  Cultures have been sent, broad-spectrum antibiotics in place  DVT prophylaxis with heparin GI prophylaxis with pantoprazole     Intervention Category Evaluation Type: New Patient Evaluation  Tashala Cumbo 05/25/2024, 2:17 AM

## 2024-05-25 NOTE — Progress Notes (Signed)
 Pharmacy Antibiotic Note  Anthony Fields is a 89 y.o. male admitted on 05/24/2024 with pneumonia and sepsis.  Pharmacy has been consulted for Vancomycin dosing. WBC mildly elevated. Renal function ok.   Plan: Vancomycin 1250 mg IV q24h >>>Estimated AUC: 495 Zosyn per MD Trend WBC, temp, renal function  F/U infectious work-up Drug levels as indicated   Height: 5' 8 (172.7 cm) Weight: 74.6 kg (164 lb 7.4 oz) IBW/kg (Calculated) : 68.4  Temp (24hrs), Avg:98.1 F (36.7 C), Min:97.4 F (36.3 C), Max:99.3 F (37.4 C)  Recent Labs  Lab 05/24/24 1738 05/24/24 1914  WBC 12.4*  --   CREATININE 0.93  --   LATICACIDVEN 2.9* 2.1*    Estimated Creatinine Clearance: 51.1 mL/min (by C-G formula based on SCr of 0.93 mg/dL).    No Known Allergies  Lynwood Mckusick, PharmD, BCPS Clinical Pharmacist Phone: [phone]

## 2024-05-26 ENCOUNTER — Inpatient Hospital Stay (HOSPITAL_COMMUNITY)

## 2024-05-26 DIAGNOSIS — J9601 Acute respiratory failure with hypoxia: Secondary | ICD-10-CM | POA: Diagnosis not present

## 2024-05-26 DIAGNOSIS — A419 Sepsis, unspecified organism: Secondary | ICD-10-CM | POA: Diagnosis not present

## 2024-05-26 DIAGNOSIS — E8729 Other acidosis: Secondary | ICD-10-CM | POA: Diagnosis not present

## 2024-05-26 LAB — BASIC METABOLIC PANEL WITH GFR
Anion gap: 9 (ref 5–15)
BUN: 13 mg/dL (ref 8–23)
CO2: 17 mmol/L — ABNORMAL LOW (ref 22–32)
Calcium: 8.4 mg/dL — ABNORMAL LOW (ref 8.9–10.3)
Chloride: 112 mmol/L — ABNORMAL HIGH (ref 98–111)
Creatinine, Ser: 0.77 mg/dL (ref 0.61–1.24)
GFR, Estimated: >60 mL/min (ref >60)
Glucose, Bld: 82 mg/dL (ref 70–99)
Potassium: 3.9 mmol/L (ref 3.5–5.1)
Sodium: 138 mmol/L (ref 135–145)

## 2024-05-26 LAB — RESPIRATORY PANEL BY PCR

## 2024-05-26 LAB — MAGNESIUM: Magnesium: 1.5 mg/dL — ABNORMAL LOW (ref 1.7–2.4)

## 2024-05-26 LAB — URINALYSIS, W/ REFLEX TO CULTURE (INFECTION SUSPECTED)
Bilirubin Urine: NEGATIVE
Glucose, UA: NEGATIVE mg/dL
Ketones, ur: NEGATIVE mg/dL
Nitrite: NEGATIVE
Protein, ur: NEGATIVE mg/dL
Specific Gravity, Urine: >1.046 — ABNORMAL HIGH (ref 1.005–1.030)
pH: 5 (ref 5.0–8.0)

## 2024-05-26 LAB — GLUCOSE, CAPILLARY
Glucose-Capillary: 64 mg/dL — ABNORMAL LOW (ref 70–99)
Glucose-Capillary: 90 mg/dL (ref 70–99)
Glucose-Capillary: 76 mg/dL (ref 70–99)
Glucose-Capillary: 88 mg/dL (ref 70–99)
Glucose-Capillary: 90 mg/dL (ref 70–99)
Glucose-Capillary: 97 mg/dL (ref 70–99)

## 2024-05-26 LAB — URINE CULTURE

## 2024-05-26 LAB — LEGIONELLA PNEUMOPHILA SEROGP 1 UR AG: L. pneumophila Serogp 1 Ur Ag: NEGATIVE

## 2024-05-26 LAB — CBC
HCT: 33.9 % — ABNORMAL LOW (ref 39.0–52.0)
Hemoglobin: 10.3 g/dL — ABNORMAL LOW (ref 13.0–17.0)
MCH: 29.4 pg (ref 26.0–34.0)
MCHC: 30.4 g/dL (ref 30.0–36.0)
MCV: 96.9 fL (ref 80.0–100.0)
Platelets: 154 10*3/uL (ref 150–400)
RBC: 3.5 MIL/uL — ABNORMAL LOW (ref 4.22–5.81)
RDW: 16.8 % — ABNORMAL HIGH (ref 11.5–15.5)
WBC: 9.2 10*3/uL (ref 4.0–10.5)
nRBC: 0 % (ref 0.0–0.2)

## 2024-05-26 LAB — BRAIN NATRIURETIC PEPTIDE: B Natriuretic Peptide: 346.4 pg/mL — ABNORMAL HIGH (ref 0.0–100.0)

## 2024-05-26 LAB — TROPONIN I (HIGH SENSITIVITY)
Troponin I (High Sensitivity): 35 ng/L — ABNORMAL HIGH (ref <18)
Troponin I (High Sensitivity): 20 ng/L — ABNORMAL HIGH (ref <18)
Troponin I (High Sensitivity): 19 ng/L — ABNORMAL HIGH (ref <18)

## 2024-05-26 LAB — PHOSPHORUS: Phosphorus: 2.7 mg/dL (ref 2.5–4.6)

## 2024-05-26 MED ORDER — APIXABAN 5 MG PO TABS
5.0000 mg | ORAL_TABLET | Freq: Two times a day (BID) | ORAL | Status: DC
  Administered 2024-05-26 – 2024-05-27 (×3): 5 mg
  Filled 2024-05-26 (×3): qty 1

## 2024-05-26 MED ORDER — POTASSIUM CHLORIDE 10 MEQ/100ML IV SOLN
10.0000 meq | INTRAVENOUS | Status: AC
  Administered 2024-05-26 (×2): 10 meq via INTRAVENOUS
  Filled 2024-05-26 (×2): qty 100

## 2024-05-26 MED ORDER — ALBUMIN HUMAN 25 % IV SOLN
25.0000 g | Freq: Four times a day (QID) | INTRAVENOUS | Status: AC
  Administered 2024-05-26 – 2024-05-27 (×3): 25 g via INTRAVENOUS
  Filled 2024-05-26 (×3): qty 100

## 2024-05-26 MED ORDER — SODIUM CHLORIDE 0.9 % IV SOLN
500.0000 mg | INTRAVENOUS | Status: AC
  Administered 2024-05-27: 500 mg via INTRAVENOUS
  Filled 2024-05-26: qty 5

## 2024-05-26 MED ORDER — FUROSEMIDE 10 MG/ML IJ SOLN
40.0000 mg | Freq: Once | INTRAMUSCULAR | Status: AC
  Administered 2024-05-26: 40 mg via INTRAVENOUS
  Filled 2024-05-26: qty 4

## 2024-05-26 MED ORDER — MAGNESIUM SULFATE 4 GM/100ML IV SOLN
4.0000 g | Freq: Once | INTRAVENOUS | Status: AC
  Administered 2024-05-26: 4 g via INTRAVENOUS
  Filled 2024-05-26: qty 100

## 2024-05-26 MED ORDER — IOHEXOL 350 MG/ML SOLN
75.0000 mL | Freq: Once | INTRAVENOUS | Status: AC | PRN
  Administered 2024-05-26: 75 mL via INTRAVENOUS

## 2024-05-26 NOTE — Progress Notes (Signed)
 NT informed RN of CBG of 64. RN rechecked with a result of 90. No intervention required.

## 2024-05-26 NOTE — Plan of Care (Signed)
  Problem: Clinical Measurements: Goal: Ability to maintain clinical measurements within normal limits will improve Outcome: Progressing Goal: Will remain free from infection Outcome: Progressing Goal: Diagnostic test results will improve Outcome: Progressing Goal: Respiratory complications will improve Outcome: Progressing Goal: Cardiovascular complication will be avoided Outcome: Progressing   Problem: Activity: Goal: Risk for activity intolerance will decrease Outcome: Progressing   Problem: Elimination: Goal: Will not experience complications related to bowel motility Outcome: Progressing Goal: Will not experience complications related to urinary retention Outcome: Progressing

## 2024-05-26 NOTE — Progress Notes (Signed)
 [facility] ADULT ICU REPLACEMENT PROTOCOL   The patient does apply for the [facility] Adult ICU Electrolyte Replacment Protocol based on the criteria listed below:   1.Exclusion criteria: TCTS, ECMO, Dialysis, and Myasthenia Gravis patients 2. Is GFR >/= 30 ml/min? Yes.    Patient's GFR today is >60 3. Is SCr </= 2? Yes.   Patient's SCr is 0.77 mg/dL 4. Did SCr increase >/= 0.5 in 24 hours? No. 5.Pt's weight >40kg  Yes.   6. Abnormal electrolyte(s): Mag 1.5  7. Electrolytes replaced per protocol 8.  Call MD STAT for K+ </= 2.5, Phos </= 1, or Mag </= 1 Physician:  Haze Ole BIRCH [facility] 05/26/2024 4:54 AM

## 2024-05-26 NOTE — Progress Notes (Signed)
   05/26/24 1400  Spiritual Encounters  Type of Visit Initial  Care provided to: Patient  Referral source Chaplain assessment  Reason for visit Routine spiritual support  OnCall Visit No   Chaplain visited and provided spiritual support through compassionate presence and empathy. Chaplain spiritual support services remain available as the need arises.

## 2024-05-26 NOTE — Progress Notes (Signed)
 eLink Physician-Brief Progress Note Patient Name: Anthony Fields DOB: [birth year] MRN: [id number]   Date of Service  05/26/2024  HPI/Events of Note  Patient has been in A-fib all day, rates are in the 1 teens/120s.  This was not treated during the daytime.  On low-dose norepinephrine at 2 mcg.  On metoprolol XL at home.  Anticoagulation restarted today.  eICU Interventions  Unless if he has sustained rates greater than 130s, no intervention for now.  Could consider the addition of amiodarone especially now that he is anticoagulated     Intervention Category Intermediate Interventions: Arrhythmia - evaluation and management  [name] 05/26/2024, 9:34 PM

## 2024-05-26 NOTE — Progress Notes (Signed)
Pt transported to CT and back to 2M08 without any complications.

## 2024-05-26 NOTE — Progress Notes (Addendum)
 NAME:  Anthony Fields, MRN:  [id number], DOB:  [birth year], LOS: 2 ADMISSION DATE:  05/24/2024, CONSULTATION DATE:  05/25/24 REFERRING MD:  [facility]  CHIEF COMPLAINT:  Urosepsis   History of Present Illness:  Pt is encephelopathic; therefore, this HPI is obtained from chart review. Anthony Fields is a 89 y.o. male who has a PMH as outlined below including recurrent urosepsis.  He had a hip fracture in March 2025 s/p surgical repair and ever since then, he has an indwelling Foley catheter.  Since having the Foley placed, he has had recurrent bouts of urosepsis.  On 7/1, family reported that he had been altered and not acting his usual self.  He was subsequently taken [facility] for further evaluation and management.   While in the ED, he was noted to have desaturations and increased work of breathing.  He was initially placed on an IV but did not tolerate and subsequently required intubation.   His chronic Foley was replaced in the ED and UA showed only trace leukocytes with negative nitrites and no bacteria.  His initial lactate was mildly up at 2.9 and he was mildly hypotensive and was subsequently started on low-dose norepinephrine.   He was later transferred to [facility] ICU for further evaluation and management.  Pertinent  Medical History  Hip Fx March 2025 with subsequent chronic foley and recurrent UTIs, HTN, HLD, GERD   Significant Hospital Events: Including procedures, antibiotic start and stop dates in addition to other pertinent events   7/1 admission, intubation   Interim History / Subjective:  Remains intubated. Intermittent presser needs but low level likely related to sedation (which is at a low level).  Objective    Blood pressure 113/61, pulse 83, temperature 97.6 F (36.4 C), temperature source Axillary, resp. rate 18, height 5' 8 (1.727 m), weight 76.8 kg, SpO2 100%.    Vent Mode: PRVC FiO2 (%):  [40 %] 40 % Set Rate:  [16 bmp] 16 bmp Vt Set:  [540 mL] 540 mL PEEP:  [5  cmH20-8 cmH20] 5 cmH20 Pressure Support:  [5 cmH20] 5 cmH20 Plateau Pressure:  [13 cmH20-18 cmH20] 17 cmH20   Intake/Output Summary (Last 24 hours) at 05/26/2024 0816 Last data filed at 05/26/2024 0700 Gross per 24 hour  Intake 740.61 ml  Output 610 ml  Net 130.61 ml   Filed Weights   05/24/24 1726 05/25/24 0200 05/26/24 0500  Weight: 85 kg 74.6 kg 76.8 kg    Examination: General: Elderly male sedated and intubated in bed Neuro: Sedated, minimally responsive. Will wiggle toes slightly but otherwise does nof follow commands. HEENT: [place]/AT. Sclerae anicteric. ETT in place. Cardiovascular: RRR no MRG.  Lungs: Respirations even and unlabored.  Coarse bilaterally. Abdomen: BS x 4, soft, NT/ND.  Musculoskeletal: Right hand congenital deformity. Trace LE edema.   Resolved problem list  Lactic acidosis   Assessment and Plan   Acute hypoxic respiratory failure - cause unclear CAP vs UTI vs PE vs acute HFrEF Sepsis with Shock vs hypotension secondary to sedation Remains intubated, minimal settings and does well with spontaneous breathing but would prefer him be more awake. Will likely reattempt extubation today. Although he returned to 2 of levophed this am, I suspect that is from his sedation, which will be stopped.  White count is resolving, remains afebrile. Procal very low yesterday arguing against a CAP. History of recurrent UTIs and chronic catheter argues for urosepsis but culture data not back, no bacteria on UA as well, multiple  recent treatment regimens. Will maintain CTX for now.  With elevated BNP, ECHO yesterday with EF 45% and global hypokinesis and signs of R heart strain/pressure, effusion on CXR, will consider a cardiac cause as well. First, will get CT to rule out PE. He is prescribed eliquis that appears to have started earlier this spring. Based on those results, will consider diuresis.  Tracheal aspirate no organisms. Flu/covid/rsv negative. Procal negative. Strep urine  ag negative. MRSA negative. Bcx no growth at 2 days. BNP is elevated.  - Maintain CTX, stop zithromax after today  - Vent on low settings, SBT and extubate as able - Wean norepi as able for Maps > 65 - Urine Legionella pending - Follow Blood cultures - get extended RVP - Recheck BNP - troponins - CT PE study - consider diuresis   Hypomagnesemia 1.5 this am. Replaced.   Hx Recurrent UTIs Catheter replaced at admission. UA without bacteria. - follow urine culture - Abx per above   Hx PAF  In afib this morning. Recently started Eliquis, diltiazem, toprol. Resume DOAC.   HTN, HLD - Continue PTA atorvastatin. - Hold PTA amlodipine, losartan, fenofibrate, niacin.  Best Practice (right click and Reselect all SmartList Selections daily)   Diet/type: NPO DVT prophylaxis: prophylactic heparin  Pressure ulcer(s): present on admission  - see RN notes, per RN stage 2 buttocks. GI prophylaxis: PPI Lines: N/A Foley:  Yes, and it is still needed - changed in ED Code Status:  full code Last date of multidisciplinary goals of care discussion: None yet.  Labs   CBC: Recent Labs  Lab 05/24/24 1738 05/25/24 0944 05/26/24 0404  WBC 12.4* 11.0* 9.2  NEUTROABS 10.1*  --   --   HGB 12.0* 10.8* 10.3*  HCT 37.3* 34.2* 33.9*  MCV 93.0 92.9 96.9  PLT 256 213 154    Basic Metabolic Panel: Recent Labs  Lab 05/24/24 1738 05/25/24 0944 05/26/24 0404  NA 137 140 138  K 3.8 4.0 3.9  CL 107 110 112*  CO2 19* 20* 17*  GLUCOSE 181* 101* 82  BUN 14 14 13   CREATININE 0.93 0.79 0.77  CALCIUM 8.8* 8.7* 8.4*  MG  --  1.4* 1.5*  PHOS  --  2.8  --    GFR: Estimated Creatinine Clearance: 59.4 mL/min (by C-G formula based on SCr of 0.77 mg/dL). Recent Labs  Lab 05/24/24 1738 05/24/24 1914 05/25/24 0944 05/26/24 0404  PROCALCITON  --   --  <0.10  --   WBC 12.4*  --  11.0* 9.2  LATICACIDVEN 2.9* 2.1* 1.9  --     Liver Function Tests: Recent Labs  Lab 05/24/24 1738  AST 54*   ALT 37  ALKPHOS 71  BILITOT 1.8*  PROT 6.0*  ALBUMIN 2.6*   No results for input(s): LIPASE, AMYLASE in the last 168 hours. No results for input(s): AMMONIA in the last 168 hours.  ABG    Component Value Date/Time   PHART 7.34 (L) 05/24/2024 2337   PCO2ART 39 05/24/2024 2337   PO2ART 157 (H) 05/24/2024 2337   HCO3 21.0 05/24/2024 2337   ACIDBASEDEF 4.2 (H) 05/24/2024 2337   O2SAT 99.4 05/24/2024 2337     Coagulation Profile: Recent Labs  Lab 05/24/24 1738  INR 2.7*    Cardiac Enzymes: No results for input(s): CKTOTAL, CKMB, CKMBINDEX, TROPONINI in the last 168 hours.  HbA1C: No results found for: HGBA1C  CBG: Recent Labs  Lab 05/25/24 1929 05/25/24 2314 05/26/24 9682 05/26/24 0322 05/26/24 9256  GLUCAP 90 85 64* 90 76    Review of Systems:   Sedated. Negative per above.  Past Medical History:  He,  has a past medical history of Acid reflux, Atrial fibrillation (HCC), Hand deformity, congenital, High cholesterol, and Hypertension.   Surgical History:   Past Surgical History:  Procedure Laterality Date   CARDIAC CATHETERIZATION     CORONARY ANGIOPLASTY WITH STENT PLACEMENT     HIP ARTHROSCOPY Left    March 2025   I & D EXTREMITY  11/14/2012   Procedure: IRRIGATION AND DEBRIDEMENT EXTREMITY;  Surgeon: Balinda JAYSON Rogue, MD;  Location: [facility] OR;  Service: Plastics;  Laterality: Right;     Social History:   reports that he has quit smoking. He does not have any smokeless tobacco history on file. He reports current alcohol use. He reports that he does not use drugs.   Family History:  His family history is not on file.   Allergies No Known Allergies   Home Medications  Prior to Admission medications   Medication Sig Start Date End Date Taking? Authorizing Provider  atorvastatin (LIPITOR) 40 MG tablet Take 40 mg by mouth daily.   Yes [provider]  cholecalciferol (VITAMIN D3) 25 MCG (1000 UNIT) tablet Take 1,000 Units by  mouth daily.   Yes [provider]  diltiazem (CARDIZEM) 60 MG tablet Take 60 mg by mouth daily. 04/10/24  Yes [provider]  ELIQUIS 5 MG TABS tablet Take 5 mg by mouth 2 (two) times daily.   Yes [provider]  fenofibrate (TRICOR) 145 MG tablet Take 145 mg by mouth daily.   Yes [provider]  metoprolol succinate (TOPROL-XL) 25 MG 24 hr tablet Take 25 mg by mouth daily.   Yes [provider]  niacin (NIASPAN) 1000 MG CR tablet Take 1,000 mg by mouth at bedtime.   Yes [provider]  oxybutynin (DITROPAN) 5 MG tablet Take 5 mg by mouth 2 (two) times daily. 05/03/24  Yes [provider]  pantoprazole (PROTONIX) 40 MG tablet Take 40 mg by mouth daily.   Yes [provider]  tamsulosin (FLOMAX) 0.4 MG CAPS capsule Take 0.4 mg by mouth daily after supper.   Yes [provider]  cefUROXime (CEFTIN) 500 MG tablet Take 500 mg by mouth 2 (two) times daily. Patient not taking: Reported on 05/25/2024 05/22/24   [provider]    Critical care time: 35 mins    Lonni Africa, DO IM Resident PGY-2

## 2024-05-27 DIAGNOSIS — A419 Sepsis, unspecified organism: Secondary | ICD-10-CM | POA: Diagnosis not present

## 2024-05-27 DIAGNOSIS — J9601 Acute respiratory failure with hypoxia: Secondary | ICD-10-CM | POA: Diagnosis not present

## 2024-05-27 DIAGNOSIS — E8729 Other acidosis: Secondary | ICD-10-CM | POA: Diagnosis not present

## 2024-05-27 LAB — RETICULOCYTES
Immature Retic Fract: 24.8 % — ABNORMAL HIGH (ref 2.3–15.9)
RBC.: 3.11 MIL/uL — ABNORMAL LOW (ref 4.22–5.81)
Retic Count, Absolute: 56.3 10*3/uL (ref 19.0–186.0)
Retic Ct Pct: 1.8 % (ref 0.4–3.1)

## 2024-05-27 LAB — CBC WITH DIFFERENTIAL/PLATELET
Abs Immature Granulocytes: 0.01 10*3/uL (ref 0.00–0.07)
Basophils Absolute: 0.1 10*3/uL (ref 0.0–0.1)
Basophils Relative: 1 %
Eosinophils Absolute: 0.5 10*3/uL (ref 0.0–0.5)
Eosinophils Relative: 7 %
HCT: 29.3 % — ABNORMAL LOW (ref 39.0–52.0)
Hemoglobin: 9.1 g/dL — ABNORMAL LOW (ref 13.0–17.0)
Immature Granulocytes: 0 %
Lymphocytes Relative: 21 %
Lymphs Abs: 1.7 10*3/uL (ref 0.7–4.0)
MCH: 29.3 pg (ref 26.0–34.0)
MCHC: 31.1 g/dL (ref 30.0–36.0)
MCV: 94.2 fL (ref 80.0–100.0)
Monocytes Absolute: 0.6 10*3/uL (ref 0.1–1.0)
Monocytes Relative: 7 %
Neutro Abs: 5 10*3/uL (ref 1.7–7.7)
Neutrophils Relative %: 64 %
Platelets: 142 10*3/uL — ABNORMAL LOW (ref 150–400)
RBC: 3.11 MIL/uL — ABNORMAL LOW (ref 4.22–5.81)
RDW: 16.7 % — ABNORMAL HIGH (ref 11.5–15.5)
WBC: 7.8 10*3/uL (ref 4.0–10.5)
nRBC: 0 % (ref 0.0–0.2)

## 2024-05-27 LAB — URINE CULTURE: Culture: NO GROWTH

## 2024-05-27 LAB — GLUCOSE, CAPILLARY
Glucose-Capillary: 79 mg/dL (ref 70–99)
Glucose-Capillary: 79 mg/dL (ref 70–99)
Glucose-Capillary: 81 mg/dL (ref 70–99)
Glucose-Capillary: 82 mg/dL (ref 70–99)
Glucose-Capillary: 93 mg/dL (ref 70–99)
Glucose-Capillary: 99 mg/dL (ref 70–99)

## 2024-05-27 LAB — BASIC METABOLIC PANEL WITH GFR
Anion gap: 12 (ref 5–15)
BUN: 10 mg/dL (ref 8–23)
CO2: 21 mmol/L — ABNORMAL LOW (ref 22–32)
Calcium: 8.9 mg/dL (ref 8.9–10.3)
Chloride: 104 mmol/L (ref 98–111)
Creatinine, Ser: 0.76 mg/dL (ref 0.61–1.24)
GFR, Estimated: >60 mL/min (ref >60)
Glucose, Bld: 90 mg/dL (ref 70–99)
Potassium: 3.6 mmol/L (ref 3.5–5.1)
Sodium: 137 mmol/L (ref 135–145)

## 2024-05-27 LAB — CULTURE, RESPIRATORY W GRAM STAIN

## 2024-05-27 LAB — MAGNESIUM: Magnesium: 2.1 mg/dL (ref 1.7–2.4)

## 2024-05-27 LAB — PHOSPHORUS: Phosphorus: 3.5 mg/dL (ref 2.5–4.6)

## 2024-05-27 MED ORDER — DOCUSATE SODIUM 50 MG/5ML PO LIQD
100.0000 mg | Freq: Two times a day (BID) | ORAL | Status: DC
  Administered 2024-05-27: 100 mg via ORAL
  Filled 2024-05-27: qty 10

## 2024-05-27 MED ORDER — METOPROLOL TARTRATE 12.5 MG HALF TABLET
12.5000 mg | ORAL_TABLET | Freq: Once | ORAL | Status: AC
  Administered 2024-05-27: 12.5 mg via ORAL
  Filled 2024-05-27: qty 1

## 2024-05-27 MED ORDER — METOPROLOL TARTRATE 12.5 MG HALF TABLET
12.5000 mg | ORAL_TABLET | Freq: Two times a day (BID) | ORAL | Status: DC
  Administered 2024-05-27: 12.5 mg via ORAL
  Filled 2024-05-27: qty 1

## 2024-05-27 MED ORDER — METOPROLOL TARTRATE 12.5 MG HALF TABLET: 12.5000 mg | ORAL_TABLET | Freq: Two times a day (BID) | ORAL | Status: DC

## 2024-05-27 MED ORDER — ORAL CARE MOUTH RINSE: 15.0000 mL | OROMUCOSAL | Status: DC | PRN

## 2024-05-27 MED ORDER — POLYETHYLENE GLYCOL 3350 17 G PO PACK
17.0000 g | PACK | Freq: Every day | ORAL | Status: DC | PRN
Start: 2024-05-27 — End: 2024-05-28

## 2024-05-27 MED ORDER — FUROSEMIDE 10 MG/ML IJ SOLN
40.0000 mg | Freq: Once | INTRAMUSCULAR | Status: AC
  Administered 2024-05-27: 40 mg via INTRAVENOUS
  Filled 2024-05-27: qty 4

## 2024-05-27 MED ORDER — APIXABAN 5 MG PO TABS
5.0000 mg | ORAL_TABLET | Freq: Two times a day (BID) | ORAL | Status: DC
  Administered 2024-05-27 – 2024-06-01 (×10): 5 mg via ORAL
  Filled 2024-05-27 (×10): qty 1

## 2024-05-27 MED ORDER — METOPROLOL TARTRATE 25 MG PO TABS: 25.0000 mg | ORAL_TABLET | Freq: Two times a day (BID) | ORAL | Status: DC

## 2024-05-27 MED ORDER — METOPROLOL TARTRATE 5 MG/5ML IV SOLN
5.0000 mg | Freq: Once | INTRAVENOUS | Status: AC
  Administered 2024-05-27: 5 mg via INTRAVENOUS
  Filled 2024-05-27: qty 5

## 2024-05-27 MED ORDER — METOPROLOL TARTRATE 12.5 MG HALF TABLET: 12.5000 mg | ORAL_TABLET | Freq: Once | ORAL | Status: DC

## 2024-05-27 MED ORDER — POLYETHYLENE GLYCOL 3350 17 G PO PACK
17.0000 g | PACK | Freq: Every day | ORAL | Status: DC
  Administered 2024-05-29 – 2024-06-01 (×4): 17 g via ORAL
  Filled 2024-05-27 (×5): qty 1

## 2024-05-27 MED ORDER — ATORVASTATIN CALCIUM 10 MG PO TABS
20.0000 mg | ORAL_TABLET | Freq: Every day | ORAL | Status: DC
  Administered 2024-05-28 – 2024-06-01 (×5): 20 mg via ORAL
  Filled 2024-05-27 (×5): qty 2

## 2024-05-27 MED ORDER — DOCUSATE SODIUM 50 MG/5ML PO LIQD
100.0000 mg | Freq: Two times a day (BID) | ORAL | Status: DC | PRN
Start: 2024-05-27 — End: 2024-06-01

## 2024-05-27 NOTE — Progress Notes (Addendum)
 eLink Physician-Brief Progress Note Patient Name: Anthony Fields DOB: April 26, 1932 MRN: [id number]   Date of Service  05/27/2024  HPI/Events of Note  64M admitted for sepsis due to pneumonia vs UTI and acute hypoxemic respiratory failure who is intubated.   Patient is transferred to progressive care, has been tachycardic throughout the day, now into the 130s-140s.  Metoprolol due  eICU Interventions  Increased dose of scheduled metoprolol  Metoprolol IV push x 1   2032 -p.m. tartrate already delivered.  Will retimed 25 mg for tomorrow morning and administer l 12.5 mg tonight  Intervention Category Intermediate Interventions: Arrhythmia - evaluation and management  Anthony Fields 05/27/2024, 8:03 PM

## 2024-05-27 NOTE — Procedures (Signed)
 Extubation Procedure Note  Patient Details:   Name: Iaan Oregel DOB: 27-Nov-1931 MRN: [id number]   Airway Documentation:    Vent end date: 05/27/24 Vent end time: 0952   Evaluation  O2 sats: stable throughout Complications: No apparent complications Patient did tolerate procedure well. Bilateral Breath Sounds: Clear   Yes  Pt extubated per MD order, placed on 3L [place]. Positive cuff leak noted, no stridor heard at this time. RT will continue to monitor.   Duwaine Charles 05/27/2024, 9:55 AM

## 2024-05-27 NOTE — Evaluation (Signed)
 Physical Therapy Evaluation Patient Details Name: Anthony Fields MRN: [id number] DOB: 02-02-1932 Today's Date: 05/27/2024  History of Present Illness  89 yo male admitted 6/30 with urosepsis and pnA with respiratory decline in ED and intubated. 7/3 extubated. PMHx; hip fx, indwelling foley  Clinical Impression  Pt pleasant, joking and reports living alone with family next door. Pt with limited walking since Lt hip fx in March and family has been assisting. Pt with left hip internal rotation and limited ability to control and manipulate RW appropriately during this session. Pt currently with decreased strength, transfers, gait and function who will benefit from acute therapy to maximize mobility and independence. Pt in Afib with HR max 150 with limited activity and return to 122 at rest. SPO2 on 4L 95% at rest with inconsistent pleth and desaturation to 82% with activity but unable to state accuracy. Daughter works at [employer] in [place] and would prefer this location for post acute rehab. Patient will benefit from continued inpatient follow up therapy, <3 hours/day   Supine 103/81 (89) Sitting 108/72 (83)        If plan is discharge home, recommend the following: A lot of help with walking and/or transfers;A lot of help with bathing/dressing/bathroom;Assistance with cooking/housework;Assist for transportation;Help with stairs or ramp for entrance   Can travel by private vehicle   No    Equipment Recommendations None recommended by PT  Recommendations for Other Services  OT consult    Functional Status Assessment Patient has had a recent decline in their functional status and/or demonstrates limited ability to make significant improvements in function in a reasonable and predictable amount of time     Precautions / Restrictions Precautions Precautions: Fall;Other (comment) Recall of Precautions/Restrictions: Impaired Precaution/Restrictions Comments: watch sats and HR, left hip  internal rotation      Mobility  Bed Mobility Overal bed mobility: Needs Assistance Bed Mobility: Supine to Sit     Supine to sit: HOB elevated, Used rails, Min assist     General bed mobility comments: min assist to clear legs and elevate trunk pivoting to Rt side of bed. Pt limited by impaired left hip and shoulder requiring assist. min assist to scoot fully to EOB    Transfers Overall transfer level: Needs assistance   Transfers: Sit to/from Stand, Bed to chair/wheelchair/BSC Sit to Stand: Min assist Stand pivot transfers: Min assist         General transfer comment: min assist to rise from surface and pivot to chair with use of RW. Pt maintains self rotated 45 degrees within RW and left hip internal rotation with assist to direct and manage RW as well as control pivot to chair    Ambulation/Gait               General Gait Details: not yet able  Stairs            Wheelchair Mobility     Tilt Bed    Modified Rankin (Stroke Patients Only)       Balance Overall balance assessment: Needs assistance, History of Falls Sitting-balance support: No upper extremity supported, Feet supported Sitting balance-Leahy Scale: Fair     Standing balance support: Bilateral upper extremity supported, During functional activity, Reliant on assistive device for balance Standing balance-Leahy Scale: Poor Standing balance comment: RW in standing                             Pertinent Vitals/Pain  Pain Assessment Pain Assessment: No/denies pain    Home Living Family/patient expects to be discharged to:: Private residence Living Arrangements: Alone Available Help at Discharge: Family;Available PRN/intermittently Type of Home: House Home Access: Ramped entrance       Home Layout: Two level;Able to live on main level with bedroom/bathroom Home Equipment: Rolling Walker (2 wheels);Lift chair;BSC/3in1;Wheelchair - manual Additional Comments: sleeps in  lift chair    Prior Function Prior Level of Function : Needs assist             Mobility Comments: walks grossly 40' at a time with RW, WC for community ADLs Comments: can perform basic ADLs, family assist with all IADLs     Extremity/Trunk Assessment   Upper Extremity Assessment Upper Extremity Assessment: Generalized weakness;LUE deficits/detail LUE Deficits / Details: rotator cuff tear with limited shoulder motion grossly 70 degrees flexion    Lower Extremity Assessment Lower Extremity Assessment: Generalized weakness;LLE deficits/detail LLE Deficits / Details: left hip internal rotation (since fx per daughter)    Cervical / Trunk Assessment Cervical / Trunk Assessment: Kyphotic  Communication   Communication Factors Affecting Communication: Hearing impaired    Cognition Arousal: Alert Behavior During Therapy: Flat affect   PT - Cognitive impairments: Safety/Judgement                       PT - Cognition Comments: cues for safety and lines, pt keeping body rotated 45 degrees within RW despite cues Following commands: Impaired Following commands impaired: Follows one step commands with increased time     Cueing Cueing Techniques: Verbal cues, Gestural cues, Tactile cues     General Comments      Exercises     Assessment/Plan    PT Assessment Patient needs continued PT services  PT Problem List Decreased strength;Decreased coordination;Cardiopulmonary status limiting activity;Decreased activity tolerance;Decreased range of motion;Decreased knowledge of use of DME;Decreased safety awareness;Decreased mobility;Decreased balance       PT Treatment Interventions Gait training;Functional mobility training;Therapeutic activities;Therapeutic exercise;Patient/family education;Neuromuscular re-education;DME instruction;Balance training;Cognitive remediation    PT Goals (Current goals can be found in the Care Plan section)  Acute Rehab PT Goals Patient  Stated Goal: return home PT Goal Formulation: With patient/family Time For Goal Achievement: 06/10/24 Potential to Achieve Goals: Fair    Frequency Min 2X/week     Co-evaluation               AM-PAC PT 6 Clicks Mobility  Outcome Measure Help needed turning from your back to your side while in a flat bed without using bedrails?: A Little Help needed moving from lying on your back to sitting on the side of a flat bed without using bedrails?: A Little Help needed moving to and from a bed to a chair (including a wheelchair)?: A Little Help needed standing up from a chair using your arms (e.g., wheelchair or bedside chair)?: A Little Help needed to walk in hospital room?: A Lot Help needed climbing 3-5 steps with a railing? : Total 6 Click Score: 15    End of Session Equipment Utilized During Treatment: Gait belt;Oxygen Activity Tolerance: Patient tolerated treatment well Patient left: in chair;with call bell/phone within reach;with family/visitor present;with chair alarm set Nurse Communication: Mobility status PT Visit Diagnosis: Other abnormalities of gait and mobility (R26.89);Difficulty in walking, not elsewhere classified (R26.2);Muscle weakness (generalized) (M62.81)    Time: 8840-8767 PT Time Calculation (min) (ACUTE ONLY): 33 min   Charges:   PT Evaluation $PT Eval Moderate Complexity:  1 Mod PT Treatments $Therapeutic Activity: 8-22 mins PT General Charges $$ ACUTE PT VISIT: 1 Visit         Lenoard SQUIBB, PT Acute Rehabilitation Services Office: [phone]   Lenoard NOVAK Trudie Cervantes 05/27/2024, 12:59 PM

## 2024-05-27 NOTE — Progress Notes (Addendum)
 NAME:  Anthony Fields, MRN:  [id number], DOB:  [birth year], LOS: 3 ADMISSION DATE:  05/24/2024, CONSULTATION DATE:  05/25/24 REFERRING MD:  [facility]  CHIEF COMPLAINT:  Urosepsis   History of Present Illness:  Pt is encephelopathic; therefore, this HPI is obtained from chart review. Anthony Fields is a 89 y.o. male who has a PMH as outlined below including recurrent urosepsis.  He had a hip fracture in March 2025 s/p surgical repair and ever since then, he has an indwelling Foley catheter.  Since having the Foley placed, he has had recurrent bouts of urosepsis.  On 7/1, family reported that he had been altered and not acting his usual self.  He was subsequently taken [facility] for further evaluation and management.   While in the ED, he was noted to have desaturations and increased work of breathing.  He was initially placed on an IV but did not tolerate and subsequently required intubation.   His chronic Foley was replaced in the ED and UA showed only trace leukocytes with negative nitrites and no bacteria.  His initial lactate was mildly up at 2.9 and he was mildly hypotensive and was subsequently started on low-dose norepinephrine.   He was later transferred to [facility] ICU for further evaluation and management.  Pertinent  Medical History  Hip Fx March 2025 with subsequent chronic foley and recurrent UTIs, HTN, HLD, GERD   Significant Hospital Events: Including procedures, antibiotic start and stop dates in addition to other pertinent events   7/1 admission, intubation   Interim History / Subjective:  Remains intubated. Somewhat more alert today - moves toes, tongue, opens eyes.  Objective    Blood pressure 116/75, pulse (!) 135, temperature 97.6 F (36.4 C), temperature source Axillary, resp. rate 19, height 5' 8 (1.727 m), weight 72.5 kg, SpO2 94%.    Vent Mode: PRVC FiO2 (%):  [40 %] 40 % Set Rate:  [16 bmp] 16 bmp Vt Set:  [540 mL] 540 mL PEEP:  [5 cmH20] 5 cmH20 Pressure  Support:  [5 cmH20] 5 cmH20 Plateau Pressure:  [12 cmH20-16 cmH20] 12 cmH20   Intake/Output Summary (Last 24 hours) at 05/27/2024 1004 Last data filed at 05/27/2024 0523 Gross per 24 hour  Intake 913.33 ml  Output 2050 ml  Net -1136.67 ml   Filed Weights   05/25/24 0200 05/26/24 0500 05/27/24 0356  Weight: 74.6 kg 76.8 kg 72.5 kg   Examination: General: Elderly male sedated and intubated in bed Neuro: Somewhat responsive without sedation - opens eyes slightly. HEENT: [place]/AT. Sclerae anicteric. ETT in place. Cardiovascular: RRR no MRG.  Lungs: Respirations even and unlabored.  Coarse bilaterally. Abdomen: Soft, nontender, nondistended, normal bowel sounds Musculoskeletal: Right hand congenital deformity. Trace LE edema.   Resolved problem list   Assessment and Plan   Acute hypoxic respiratory failure Acute HFrEF vs CAP vs UTI  Remains intubated on minimal settings and without any sedation is responsiveness has improved since admission. Off pressers. Off sedatives. Will attempt extubation today.  Tracheal aspirate no organisms. Flu/covid/rsv/RVP negative. Procal negative. Strep urine ag and legionella negative. MRSA negative. Bcx no growth. BNP is elevated. White count is resolving, remains afebrile. Low procal argues against a CAP. History of recurrent UTIs and chronic catheter suggests urosepsis but needed to re culture urine. Blood cultures remain negative. Will maintain CTX for now. Some blood tinged sputum as well, if he declines may consider steroids for an inflammatory process.   Also treating for heart failure with  elevated BNP, ECHO EF 45% and global hypokinesis and signs of R heart strain/pressure, effusion on CXR. CTA ruled out PE. Will diurese IV lasix 40 after same treatment with good response yesterday.   - CTX 5 days today - Vent on low settings, SBT and extubate as able - OFF norepi for Maps > 65 - Follow Blood cultures - gentle diuresis   Acute Normocytic  Anemia Steady decrease from a baseline above 12. No apparent bleeding. All cell lines down, may be dilutional, but he was diuresed yesterday. Will rule out hemolysis, look at reticulocytes, and trend hg.  Hypomagnesemia 2.1 this am. Replaced.   Hx Recurrent UTIs Catheter replaced at admission. UA without bacteria. - follow urine culture - Abx per above   Hx PAF  In afib this morning. Rates climbing above 120. Recently started Eliquis, diltiazem, toprol. Given reduced EF, likely will avoid diltiazem in future. - Continue DOAC.  - Resume metoprolol tartrate 12.5 BID, monitor pressures.   HTN, HLD - Continue PTA atorvastatin. - Hold PTA amlodipine, losartan, fenofibrate, niacin.  Best Practice (right click and Reselect all SmartList Selections daily)   Diet/type: NPO DVT prophylaxis: prophylactic heparin  Pressure ulcer(s): present on admission  - see RN notes, per RN stage 2 buttocks. GI prophylaxis: PPI Lines: N/A Foley:  Yes, and it is still needed - changed in ED Code Status:  full code Last date of multidisciplinary goals of care discussion: None yet.  Labs   CBC: Recent Labs  Lab 05/24/24 1738 05/25/24 0944 05/26/24 0404 05/27/24 0339  WBC 12.4* 11.0* 9.2 7.8  NEUTROABS 10.1*  --   --  5.0  HGB 12.0* 10.8* 10.3* 9.1*  HCT 37.3* 34.2* 33.9* 29.3*  MCV 93.0 92.9 96.9 94.2  PLT 256 213 154 142*   Basic Metabolic Panel: Recent Labs  Lab 05/24/24 1738 05/25/24 0944 05/26/24 0404 05/26/24 0943 05/27/24 0339  NA 137 140 138  --  137  K 3.8 4.0 3.9  --  3.6  CL 107 110 112*  --  104  CO2 19* 20* 17*  --  21*  GLUCOSE 181* 101* 82  --  90  BUN 14 14 13   --  10  CREATININE 0.93 0.79 0.77  --  0.76  CALCIUM 8.8* 8.7* 8.4*  --  8.9  MG  --  1.4* 1.5*  --  2.1  PHOS  --  2.8  --  2.7 3.5   GFR: Estimated Creatinine Clearance: 59.4 mL/min (by C-G formula based on SCr of 0.76 mg/dL). Recent Labs  Lab 05/24/24 1738 05/24/24 1914 05/25/24 0944 05/26/24 0404  05/27/24 0339  PROCALCITON  --   --  <0.10  --   --   WBC 12.4*  --  11.0* 9.2 7.8  LATICACIDVEN 2.9* 2.1* 1.9  --   --    Liver Function Tests: Recent Labs  Lab 05/24/24 1738  AST 54*  ALT 37  ALKPHOS 71  BILITOT 1.8*  PROT 6.0*  ALBUMIN 2.6*   No results for input(s): LIPASE, AMYLASE in the last 168 hours. No results for input(s): AMMONIA in the last 168 hours.  ABG    Component Value Date/Time   PHART 7.34 (L) 05/24/2024 2337   PCO2ART 39 05/24/2024 2337   PO2ART 157 (H) 05/24/2024 2337   HCO3 21.0 05/24/2024 2337   ACIDBASEDEF 4.2 (H) 05/24/2024 2337   O2SAT 99.4 05/24/2024 2337    Coagulation Profile: Recent Labs  Lab 05/24/24 1738  INR 2.7*  Cardiac Enzymes: No results for input(s): CKTOTAL, CKMB, CKMBINDEX, TROPONINI in the last 168 hours.  HbA1C: No results found for: HGBA1C  CBG: Recent Labs  Lab 05/26/24 1522 05/26/24 1931 05/26/24 2351 05/27/24 0319 05/27/24 0737  GLUCAP 90 97 99 79 79   Review of Systems:   Intubated. Negative.  Past Medical History:  He,  has a past medical history of Acid reflux, Atrial fibrillation (HCC), Hand deformity, congenital, High cholesterol, and Hypertension.   Surgical History:   Past Surgical History:  Procedure Laterality Date   CARDIAC CATHETERIZATION     CORONARY ANGIOPLASTY WITH STENT PLACEMENT     HIP ARTHROSCOPY Left    March 2025   I & D EXTREMITY  11/14/2012   Procedure: IRRIGATION AND DEBRIDEMENT EXTREMITY;  Surgeon: Balinda JAYSON Rogue, MD;  Location: [facility] OR;  Service: Plastics;  Laterality: Right;     Social History:   reports that he has quit smoking. He does not have any smokeless tobacco history on file. He reports current alcohol use. He reports that he does not use drugs.   Family History:  His family history is not on file.   Allergies No Known Allergies   Home Medications  Prior to Admission medications   Medication Sig Start Date End Date Taking? Authorizing  Provider  atorvastatin (LIPITOR) 40 MG tablet Take 40 mg by mouth daily.   Yes [provider]  cholecalciferol (VITAMIN D3) 25 MCG (1000 UNIT) tablet Take 1,000 Units by mouth daily.   Yes [provider]  diltiazem (CARDIZEM) 60 MG tablet Take 60 mg by mouth daily. 04/10/24  Yes [provider]  ELIQUIS 5 MG TABS tablet Take 5 mg by mouth 2 (two) times daily.   Yes [provider]  fenofibrate (TRICOR) 145 MG tablet Take 145 mg by mouth daily.   Yes [provider]  metoprolol succinate (TOPROL-XL) 25 MG 24 hr tablet Take 25 mg by mouth daily.   Yes [provider]  niacin (NIASPAN) 1000 MG CR tablet Take 1,000 mg by mouth at bedtime.   Yes [provider]  oxybutynin (DITROPAN) 5 MG tablet Take 5 mg by mouth 2 (two) times daily. 05/03/24  Yes [provider]  pantoprazole (PROTONIX) 40 MG tablet Take 40 mg by mouth daily.   Yes [provider]  tamsulosin (FLOMAX) 0.4 MG CAPS capsule Take 0.4 mg by mouth daily after supper.   Yes [provider]  cefUROXime (CEFTIN) 500 MG tablet Take 500 mg by mouth 2 (two) times daily. Patient not taking: Reported on 05/25/2024 05/22/24   [provider]     Critical care time: 35 mins    Lonni Africa, DO IM Resident PGY-2

## 2024-05-28 ENCOUNTER — Inpatient Hospital Stay (HOSPITAL_COMMUNITY)

## 2024-05-28 DIAGNOSIS — J189 Pneumonia, unspecified organism: Secondary | ICD-10-CM | POA: Diagnosis not present

## 2024-05-28 DIAGNOSIS — R6521 Severe sepsis with septic shock: Secondary | ICD-10-CM | POA: Diagnosis not present

## 2024-05-28 DIAGNOSIS — I4891 Unspecified atrial fibrillation: Secondary | ICD-10-CM

## 2024-05-28 DIAGNOSIS — A419 Sepsis, unspecified organism: Secondary | ICD-10-CM | POA: Diagnosis not present

## 2024-05-28 DIAGNOSIS — I5021 Acute systolic (congestive) heart failure: Secondary | ICD-10-CM | POA: Diagnosis not present

## 2024-05-28 LAB — CBC WITH DIFFERENTIAL/PLATELET
Abs Immature Granulocytes: 0.06 K/uL (ref 0.00–0.07)
Basophils Absolute: 0.1 K/uL (ref 0.0–0.1)
Basophils Relative: 1 %
Eosinophils Absolute: 0.3 K/uL (ref 0.0–0.5)
Eosinophils Relative: 3 %
HCT: 32.4 % — ABNORMAL LOW (ref 39.0–52.0)
Hemoglobin: 10.7 g/dL — ABNORMAL LOW (ref 13.0–17.0)
Immature Granulocytes: 1 %
Lymphocytes Relative: 20 %
Lymphs Abs: 1.9 K/uL (ref 0.7–4.0)
MCH: 30.1 pg (ref 26.0–34.0)
MCHC: 33 g/dL (ref 30.0–36.0)
MCV: 91.3 fL (ref 80.0–100.0)
Monocytes Absolute: 0.7 K/uL (ref 0.1–1.0)
Monocytes Relative: 7 %
Neutro Abs: 6.2 K/uL (ref 1.7–7.7)
Neutrophils Relative %: 68 %
Platelets: 224 K/uL (ref 150–400)
RBC: 3.55 MIL/uL — ABNORMAL LOW (ref 4.22–5.81)
RDW: 17.3 % — ABNORMAL HIGH (ref 11.5–15.5)
WBC: 9.1 K/uL (ref 4.0–10.5)
nRBC: 0 % (ref 0.0–0.2)

## 2024-05-28 LAB — BASIC METABOLIC PANEL WITH GFR
Anion gap: 15 (ref 5–15)
BUN: 11 mg/dL (ref 8–23)
CO2: 23 mmol/L (ref 22–32)
Calcium: 9 mg/dL (ref 8.9–10.3)
Chloride: 103 mmol/L (ref 98–111)
Creatinine, Ser: 0.68 mg/dL (ref 0.61–1.24)
GFR, Estimated: >60 mL/min (ref >60)
Glucose, Bld: 68 mg/dL — ABNORMAL LOW (ref 70–99)
Potassium: 3.3 mmol/L — ABNORMAL LOW (ref 3.5–5.1)
Sodium: 141 mmol/L (ref 135–145)

## 2024-05-28 LAB — GLUCOSE, CAPILLARY: Glucose-Capillary: 82 mg/dL (ref 70–99)

## 2024-05-28 LAB — PROCALCITONIN: Procalcitonin: <0.1 ng/mL

## 2024-05-28 LAB — PHOSPHORUS: Phosphorus: 2.2 mg/dL — ABNORMAL LOW (ref 2.5–4.6)

## 2024-05-28 LAB — BRAIN NATRIURETIC PEPTIDE: B Natriuretic Peptide: 838.4 pg/mL — ABNORMAL HIGH (ref 0.0–100.0)

## 2024-05-28 LAB — C-REACTIVE PROTEIN: CRP: 11.2 mg/dL — ABNORMAL HIGH (ref <1.0)

## 2024-05-28 MED ORDER — FUROSEMIDE 10 MG/ML IJ SOLN
20.0000 mg | Freq: Two times a day (BID) | INTRAMUSCULAR | Status: DC
  Administered 2024-05-28 (×2): 20 mg via INTRAVENOUS
  Filled 2024-05-28 (×2): qty 2

## 2024-05-28 MED ORDER — DIGOXIN 0.25 MG/ML IJ SOLN: 0.2500 mg | Freq: Four times a day (QID) | INTRAMUSCULAR | Status: DC

## 2024-05-28 MED ORDER — METOPROLOL TARTRATE 25 MG PO TABS
25.0000 mg | ORAL_TABLET | Freq: Two times a day (BID) | ORAL | Status: DC
  Administered 2024-05-28: 25 mg via ORAL
  Filled 2024-05-28: qty 1

## 2024-05-28 MED ORDER — TAMSULOSIN HCL 0.4 MG PO CAPS
0.4000 mg | ORAL_CAPSULE | Freq: Every day | ORAL | Status: DC
  Administered 2024-05-28 – 2024-05-31 (×4): 0.4 mg via ORAL
  Filled 2024-05-28 (×4): qty 1

## 2024-05-28 MED ORDER — HALOPERIDOL LACTATE 5 MG/ML IJ SOLN
3.0000 mg | Freq: Four times a day (QID) | INTRAMUSCULAR | Status: DC | PRN
  Administered 2024-05-28: 3 mg via INTRAMUSCULAR
  Filled 2024-05-28: qty 1

## 2024-05-28 MED ORDER — FENOFIBRATE 54 MG PO TABS
54.0000 mg | ORAL_TABLET | Freq: Every day | ORAL | Status: DC
  Administered 2024-05-28 – 2024-06-01 (×5): 54 mg via ORAL
  Filled 2024-05-28 (×5): qty 1

## 2024-05-28 MED ORDER — OXYBUTYNIN CHLORIDE 5 MG PO TABS
5.0000 mg | ORAL_TABLET | Freq: Two times a day (BID) | ORAL | Status: DC
  Administered 2024-05-28 – 2024-06-01 (×9): 5 mg via ORAL
  Filled 2024-05-28 (×10): qty 1

## 2024-05-28 MED ORDER — POTASSIUM CHLORIDE CRYS ER 20 MEQ PO TBCR
40.0000 meq | EXTENDED_RELEASE_TABLET | Freq: Once | ORAL | Status: AC
  Administered 2024-05-28: 40 meq via ORAL
  Filled 2024-05-28: qty 2

## 2024-05-28 MED ORDER — MELATONIN 5 MG PO TABS
5.0000 mg | ORAL_TABLET | Freq: Every evening | ORAL | Status: DC | PRN
  Administered 2024-05-28: 5 mg via ORAL
  Filled 2024-05-28: qty 1

## 2024-05-28 MED ORDER — METOPROLOL TARTRATE 25 MG PO TABS
25.0000 mg | ORAL_TABLET | Freq: Four times a day (QID) | ORAL | Status: DC
  Administered 2024-05-28 – 2024-05-29 (×5): 25 mg via ORAL
  Filled 2024-05-28 (×5): qty 1

## 2024-05-28 MED ORDER — PANTOPRAZOLE SODIUM 40 MG PO TBEC
40.0000 mg | DELAYED_RELEASE_TABLET | Freq: Every day | ORAL | Status: DC
  Administered 2024-05-28 – 2024-05-31 (×4): 40 mg via ORAL
  Filled 2024-05-28 (×4): qty 1

## 2024-05-28 MED ORDER — DILTIAZEM HCL 25 MG/5ML IV SOLN: 10.0000 mg | Freq: Four times a day (QID) | INTRAVENOUS | Status: DC | PRN

## 2024-05-28 NOTE — Consult Note (Addendum)
 CARDIOLOGY CONSULT NOTE    Patient ID: Anthony Fields; [id number]; [birth year]   Admit date: 05/24/2024 Date of Consult: 05/28/2024  Primary Care Provider: Maree Virgilio SAUNDERS, MD Primary Cardiologist:  Primary Electrophysiologist:    History of Present Illness:   Mr. Anthony Fields is a 89 year old M known to have persistent A-fib (diagnosed 1 year ago in [place]), HTN, HLD was transferred from [facility] for septic shock secondary to recurrent urosepsis, mechanically ventilated and on pressors and atrial fibrillation with RVR.  Extubated yesterday, doing well.  Not on pressors.  Provided me with all the history.  Collateral history is obtained from the daughter and son-in-law.  Telemetry reviewed, HR ranges between 90 and 110 bpm.  He does have mild SOB during my interview.  Otherwise denies having any chest pain, dizziness, palpitations.  He was diagnosed with atrial fibrillation around 1 year ago normal.  He was initially on Coumadin that was later switched to Eliquis  and has been tolerating well.  Past Medical History:  Diagnosis Date   Acid reflux    Atrial fibrillation (HCC)    Hand deformity, congenital    right hand -fingers   High cholesterol    Hypertension     Past Surgical History:  Procedure Laterality Date   CARDIAC CATHETERIZATION     CORONARY ANGIOPLASTY WITH STENT PLACEMENT     HIP ARTHROSCOPY Left    March 2025   I & D EXTREMITY  11/14/2012   Procedure: IRRIGATION AND DEBRIDEMENT EXTREMITY;  Surgeon: Balinda JAYSON Rogue, MD;  Location: [facility] OR;  Service: Plastics;  Laterality: Right;     Inpatient Medications: Scheduled Meds:  apixaban   5 mg Oral BID   arformoterol   15 mcg Nebulization BID   atorvastatin   20 mg Oral Daily   Chlorhexidine  Gluconate Cloth  6 each Topical Daily   fenofibrate   54 mg Oral Daily   metoprolol  tartrate  25 mg Oral BID   oxybutynin   5 mg Oral BID   pantoprazole   40 mg Oral Q supper   polyethylene glycol  17 g Oral Daily   revefenacin   175 mcg Nebulization  Daily   tamsulosin   0.4 mg Oral QPC supper   Continuous Infusions:  cefTRIAXone  (ROCEPHIN )  IV 2 g (05/28/24 1022)   PRN Meds: diltiazem , docusate, mouth rinse  Allergies:   No Known Allergies  Social History:   Social History   Socioeconomic History   Marital status: Married    Spouse name: Not on file   Number of children: Not on file   Years of education: Not on file   Highest education level: Not on file  Occupational History   Not on file  Tobacco Use   Smoking status: Former   Smokeless tobacco: Not on file  Vaping Use   Vaping status: Never Used  Substance and Sexual Activity   Alcohol use: Yes    Comment: 2-3 nights aweek   Drug use: Never   Sexual activity: Not Currently  Other Topics Concern   Not on file  Social History Narrative   Not on file   Social Drivers of Health   Financial Resource Strain: Not on file  Food Insecurity: Not on file  Transportation Needs: Not on file  Physical Activity: Not on file  Stress: Not on file  Social Connections: Not on file  Intimate Partner Violence: Not on file    Family History:   No family history on file.   ROS:  Please see the history of present  illness.  ROS  All other ROS reviewed and negative.     Physical Exam/Data:   Vitals:   05/28/24 0200 05/28/24 0325 05/28/24 0819 05/28/24 0903  BP: 124/87  116/77   Pulse:   (!) 126 (!) 106  Resp: (!) 23  (!) 22 14  Temp: (!) 97.3 F (36.3 C) 97.9 F (36.6 C) 98.5 F (36.9 C)   TempSrc: Axillary Axillary Axillary   SpO2:   93%   Weight:      Height:        Intake/Output Summary (Last 24 hours) at 05/28/2024 1109 Last data filed at 05/28/2024 0650 Gross per 24 hour  Intake 57.36 ml  Output 1150 ml  Net -1092.64 ml   Filed Weights   05/25/24 0200 05/26/24 0500 05/27/24 0356  Weight: 74.6 kg 76.8 kg 72.5 kg   Body mass index is 24.3 kg/m.  General:  Well nourished, well developed, in no acute distress HEENT: normal Lymph: no adenopathy Neck:  JVD+ Endocrine:  No thryomegaly Vascular: No carotid bruits; FA pulses 2+ bilaterally without bruits  Cardiac:  normal S1, S2; RRR; no murmur  Lungs:  clear to auscultation bilaterally, no wheezing, rhonchi or rales  Abd: soft, nontender, no hepatomegaly  Ext: no edema Musculoskeletal:  No deformities, BUE and BLE strength normal and equal Skin: warm and dry  Neuro:  CNs 2-12 intact, no focal abnormalities noted Psych:  Normal affect   EKG:  The EKG was personally reviewed and demonstrates: Not performed today Telemetry:  Telemetry was personally reviewed and demonstrates: A-fib with RVR, HR 90 and 110s  Laboratory Data:  Chemistry Recent Labs  Lab 05/26/24 0404 05/27/24 0339 05/28/24 0824  NA 138 137 141  K 3.9 3.6 3.3*  CL 112* 104 103  CO2 17* 21* 23  GLUCOSE 82 90 68*  BUN 13 10 11   CREATININE 0.77 0.76 0.68  CALCIUM  8.4* 8.9 9.0  GFRNONAA >60 >60 >60  ANIONGAP 9 12 15     Recent Labs  Lab 05/24/24 1738  PROT 6.0*  ALBUMIN  2.6*  AST 54*  ALT 37  ALKPHOS 71  BILITOT 1.8*   Hematology Recent Labs  Lab 05/26/24 0404 05/27/24 0339 05/27/24 0413 05/28/24 0824  WBC 9.2 7.8  --  9.1  RBC 3.50* 3.11* 3.11* 3.55*  HGB 10.3* 9.1*  --  10.7*  HCT 33.9* 29.3*  --  32.4*  MCV 96.9 94.2  --  91.3  MCH 29.4 29.3  --  30.1  MCHC 30.4 31.1  --  33.0  RDW 16.8* 16.7*  --  17.3*  PLT 154 142*  --  224   Cardiac EnzymesNo results for input(s): TROPONINI in the last 168 hours. No results for input(s): TROPIPOC in the last 168 hours.  BNP Recent Labs  Lab 05/24/24 1538 05/26/24 0943 05/28/24 0824  BNP 479.0* 346.4* 838.4*    DDimer No results for input(s): DDIMER in the last 168 hours.  Radiology/Studies:    Assessment and Plan:   Atrial fibrillation with RVR: Chronic, newly diagnosed 1 year ago in [place].  Initially in RVR due to septic shock but currently HR ranges between 90 and 110s, improved.has received IV metoprolol  pushes and p.o. metoprolol   tartrate 12.5 mg Spot dosing till yesterday and currently on scheduled metoprolol  tartrate 25 mg twice daily.  Will increase the frequency of metoprolol  tartrate 25 mg from BID to Q6h. initially on Coumadin in the outpatient setting and later switched to Eliquis .  Tolerating well, continue Eliquis   5 mg twice daily.  Goal HR less than 110, as he is recovering from infection.  Acute systolic heart failure: Has mild SOB during my interview.  Echocardiogram this admission showed LVEF 45%, mild RV systolic dysfunction and mild pulmonary hypertension.  Received IV Lasix  40 mg for dosing for the last 2 days.  Last dose was yesterday.  Chest x-ray today showed pulmonary vascular congestion, improved from before.  Start IV Lasix  20 mg twice daily due to soft pressures.  Will need to repeat limited echocardiogram in few weeks to rule out stress-induced cardiomyopathy.  Septic shock secondary to recurrent urosepsis and pneumonia: Stable now, presented to [facility] with AMS, intubated on site, started on pressors and transferred to [facility] for further management.  Extubated yesterday, conversing well, has mild SOB during interview.  See plan above for acute systolic heart failure.  He had indwelling Foley catheter since March 2025 when he was discharged after undergoing hip replacement.  This was exchanged this admission.  Daughter wants to get this taken care of before he goes home.   For questions or updates, please contact [facility] Please consult [email or web address] for contact info under Cardiology/STEMI.   Signed, Furman Trentman Priya Eoghan Belcher, MD 05/28/2024 11:09 AM

## 2024-05-28 NOTE — NC FL2 (Signed)
 [place]  MEDICAID FL2 LEVEL OF CARE FORM     IDENTIFICATION  Patient Name: Anthony Fields Birthdate: 07-Dec-1931 Sex: male Admission Date (Current Location): 05/24/2024  [facility] [place] Number:  Producer, television/film/video and Address:  [facility] [place], [address] [place], [place] [address]      Provider Number: [id number]  Attending Physician Name and Address:  Dennise Lavada POUR, MD  Relative Name and Phone Number:  Gamboa,donna Daughter   [phone]    Current Level of Care: Hospital Recommended Level of Care: Skilled Nursing Facility Prior Approval Number:    Date Approved/Denied:   PASRR Number:    Discharge Plan: SNF    Current Diagnoses: Patient Active Problem List   Diagnosis Date Noted   Acute respiratory failure with hypoxia (HCC) 05/25/2024   Pressure injury of skin 05/25/2024   Pneumonia 05/24/2024   Generalized abdominal pain 09/02/2023    Orientation RESPIRATION BLADDER Height & Weight     Self, Place  O2 Incontinent, Indwelling catheter Weight: 159 lb 13.3 oz (72.5 kg) Height:  5' 8 (172.7 cm)  BEHAVIORAL SYMPTOMS/MOOD NEUROLOGICAL BOWEL NUTRITION STATUS      Incontinent Diet (see discharge summary)  AMBULATORY STATUS COMMUNICATION OF NEEDS Skin   Total Care Verbally Skin abrasions, Other (Comment) (ecchymosis)                       Personal Care Assistance Level of Assistance  Bathing, Feeding, Dressing, Total care Bathing Assistance: Maximum assistance Feeding assistance: Limited assistance Dressing Assistance: Maximum assistance Total Care Assistance: Maximum assistance   Functional Limitations Info  Sight, Hearing, Speech Sight Info: Adequate Hearing Info: Impaired Speech Info: Adequate    SPECIAL CARE FACTORS FREQUENCY  PT (By licensed PT), OT (By licensed OT)     PT Frequency: 5x week OT Frequency: 5x week            Contractures Contractures Info: Not present    Additional Factors Info  Code  Status, Allergies Code Status Info: full Allergies Info: NKA           Current Medications (05/28/2024):  This is the current hospital active medication list Current Facility-Administered Medications  Medication Dose Route Frequency Provider Last Rate Last Admin   apixaban  (ELIQUIS ) tablet 5 mg  5 mg Oral BID Kara Carrier B, MD   5 mg at 05/28/24 1018   arformoterol  (BROVANA ) nebulizer solution 15 mcg  15 mcg Nebulization BID Dewald, Jonathan B, MD   15 mcg at 05/28/24 0902   atorvastatin  (LIPITOR) tablet 20 mg  20 mg Oral Daily Dewald, Jonathan B, MD   20 mg at 05/28/24 1018   cefTRIAXone  (ROCEPHIN ) 2 g in sodium chloride  0.9 % 100 mL IVPB  2 g Intravenous Q24H Pham, Minh Q, RPH-CPP 200 mL/hr at 05/28/24 1022 2 g at 05/28/24 1022   Chlorhexidine  Gluconate Cloth 2 % PADS 6 each  6 each Topical Daily Layman Raisin, DO   6 each at 05/28/24 1026   diltiazem  (CARDIZEM ) injection 10 mg  10 mg Intravenous Q6H PRN Singh, Prashant K, MD       docusate (COLACE) 50 MG/5ML liquid 100 mg  100 mg Oral BID PRN Kara Carrier NOVAK, MD       fenofibrate  tablet 54 mg  54 mg Oral Daily Singh, Prashant K, MD   54 mg at 05/28/24 1018   furosemide  (LASIX ) injection 20 mg  20 mg Intravenous BID Mallipeddi, Diannah SQUIBB, MD  20 mg at 05/28/24 1253   metoprolol  tartrate (LOPRESSOR ) tablet 25 mg  25 mg Oral QID Mallipeddi, Vishnu P, MD   25 mg at 05/28/24 1253   Oral care mouth rinse  15 mL Mouth Rinse PRN Kara Dorn NOVAK, MD       oxybutynin  (DITROPAN ) tablet 5 mg  5 mg Oral BID Singh, Prashant K, MD   5 mg at 05/28/24 1018   pantoprazole  (PROTONIX ) EC tablet 40 mg  40 mg Oral Q supper Pham, Minh Q, RPH-CPP       polyethylene glycol (MIRALAX  / GLYCOLAX ) packet 17 g  17 g Oral Daily Dewald, Jonathan B, MD       revefenacin  (YUPELRI ) nebulizer solution 175 mcg  175 mcg Nebulization Daily Kara Dorn NOVAK, MD   175 mcg at 05/28/24 9097   tamsulosin  (FLOMAX ) capsule 0.4 mg  0.4 mg Oral QPC supper Singh, Prashant  K, MD         Discharge Medications: Please see discharge summary for a list of discharge medications.  Relevant Imaging Results:  Relevant Lab Results:   Additional Information SSN:[id number]  Bridget Cordella Simmonds, LCSW

## 2024-05-28 NOTE — TOC Initial Note (Signed)
 Transition of Care [facility]) - Initial/Assessment Note    Patient Details  Name: Anthony Fields MRN: [id number] Date of Birth: Jan 10, 1932  Transition of Care [facility]) CM/SW Contact:    Anthony Cordella Simmonds, LCSW Phone Number: 05/28/2024, 2:57 PM  Clinical Narrative:    Pt oriented x2.  He was able to answer a few basic questions but then dozed off.  All information from daughter Anthony Fields, also in the room with her husband [facility].  Pt from [place].  Pt lives alone, [facility] and pt son are both next door and assist daily.   HH PT/RN currently in place. (Did not get agency name)   Discussed PT recommendations for SNF and [facility] in agreement.  Anthony Fields works at [facility] in [place] and pt has been there before.  She would like to pursue admission there again.     Referral sent to [facility] in hub.           Expected Discharge Plan: Skilled Nursing Facility Barriers to Discharge: Continued Medical Work up, SNF Pending bed offer   Patient Goals and CMS Choice     Choice offered to / list presented to : Adult Children (daughter [facility] requests [facility] in [place])      Expected Discharge Plan and Services In-house Referral: Clinical Social Work   Post Acute Care Choice: Skilled Nursing Facility Living arrangements for the past 2 months: Single Family Home                                      Prior Living Arrangements/Services Living arrangements for the past 2 months: Single Family Home Lives with:: Self (daughter and son live next door) Patient language and need for interpreter reviewed:: Yes        Need for Family Participation in Patient Care: Yes (Comment) Care giver support system in place?: Yes (comment) Current home services: Home PT, Home OT, Home RN (unsure which agency) Criminal Activity/Legal Involvement Pertinent to Current Situation/Hospitalization: No - Comment as needed  Activities of Daily Living      Permission Sought/Granted                   Emotional Assessment Appearance:: Appears stated age Attitude/Demeanor/Rapport: Lethargic Affect (typically observed): Pleasant Orientation: : Oriented to Self, Oriented to Place      Admission diagnosis:  Pneumonia [J18.9] Acute respiratory failure with hypoxia (HCC) [J96.01] Sepsis, due to unspecified organism, unspecified whether acute organ dysfunction present [facility]) [A41.9] Patient Active Problem List   Diagnosis Date Noted   Acute respiratory failure with hypoxia (HCC) 05/25/2024   Pressure injury of skin 05/25/2024   Pneumonia 05/24/2024   Generalized abdominal pain 09/02/2023   PCP:  Anthony Virgilio SAUNDERS, MD Pharmacy:   [facility] - [place], [place] - [facility] [address] Portal to Registered [address] [place] [address] Phone: [phone] Fax: [phone]  [facility] [address], [place] - [address] [place] [address] Phone: [phone] Fax: [phone]  [facility] [address], [place] - [address] [place] [address] Phone: [phone] Fax: [phone]  [facility] [address] [place] [address] Phone: [phone] Fax: [phone]     Social Drivers of Health (SDOH) Social History: SDOH Screenings   Tobacco Use: Medium Risk (09/02/2023)  SDOH Interventions:     Readmission Risk Interventions     No data to display

## 2024-05-28 NOTE — Care Management Important Message (Signed)
 Important Message  Patient Details  Name: Anthony Fields MRN: [id number] Date of Birth: Dec 23, 1931   Important Message Given:  Yes - Medicare IM     Claretta Deed 05/28/2024, 1:07 PM

## 2024-05-28 NOTE — Discharge Instructions (Signed)

## 2024-05-28 NOTE — Plan of Care (Signed)

## 2024-05-28 NOTE — Progress Notes (Addendum)
 PROGRESS NOTE                                                                                                                                                                                                             Patient Demographics:    Anthony Fields, is a 89 y.o. male, DOB - July 17, 1932, FMW:[id number]  Outpatient Primary MD for the patient is Maree Virgilio SAUNDERS, MD    LOS - 4  Admit date - 05/24/2024    Chief Complaint  Patient presents with   Code Sepsis       Brief Narrative (HPI from H&P)   89 y.o. male who has a PMH of recent left hip fracture in March requiring surgical correction with limited improvement in left leg function since the surgery, neurogenic bladder requiring Foley catheter placement in March during hip surgery, recurrent UTIs related to Foley catheter since then, atrial fibrillation with [place] vas 2 score of greater than 3, dyslipidemia, hypertension.  Patient with above history who was admitted initially in March for hip fracture, subsequently went home with Foley catheter and developed urosepsis in April, he was stabilized and back home, presented to [facility] ER again this time for urosepsis.  He developed CHF/pneumonia causing acute hypoxic respiratory failure requiring intubation, he was subsequently sent to [facility] under the ICU team care, he was initially requiring pressors, stabilized and transferred to my care on 05/28/2024 after his extubation on 05/27/2024.   Subjective:    Anthony Fields today has, No headache, No chest pain, No abdominal pain - No Nausea, No new weakness tingling or numbness, mild cough, no shortness of breath.  Ongoing left leg weakness since his fracture.   Assessment  & Plan :   Recurrent urosepsis in the setting of Foley catheter present on admission.  Foley catheter was changed this admission, responded well to Rocephin  continue to monitor cultures thus far  negative.  Acute hypoxic respiratory failure - unclear etiology, possible pneumonia and acute on chronic systolic CHF echocardiogram noted with EF around 40%, global hypokinesis, required antibiotics and diuretics in ICU along with intubation, responded well to treatment, extubated on 05/27/2024 currently on 4 L nasal cannula oxygen, for now we will continue Rocephin .  Speech to follow.  Monitor closely.   Sepsis with possible shock physiology - due to above initially required  pressors now off of it, sepsis pathophysiology improving.   Hx PAF  Currently on oral Lopressor , still in RVR IV Cardizem  as needed added, echo noted with EF 40%, continue home Eliquis , will obtain cardiology input.  Dyslipidemia.  On statin along with Tricor .  Essential hypertension.  For now beta-blocker.   Neurogenic bladder with indwelling Foley catheter present on admission.  Supportive care catheter was changed upon this admission, on Ditropan  and Flomax  which will be continued.  Recent left hip fracture.  Surgical correction in March.  Still quite weak, PT OT.  Incidental finding of 4.1 cm descending aortic aneurysm.  Beta-blocker with outpatient follow-up with PCP and cardiothoracic surgery       Condition - Extremely Guarded  Family Communication  : Daughter Arland [phone] updated over the phone 05/28/2024  Code Status : Full code  Consults  : PCCM, cardiology  PUD Prophylaxis : PPI   Procedures  :     CT head and C-spine.  Nonacute.    Echocardiogram.     1. Left ventricular ejection fraction, by estimation, is 45 to 50%. Left ventricular ejection fraction by 2D MOD biplane is 38.7 %. The left ventricle has mildly decreased function. The left ventricle demonstrates global hypokinesis. There is mild asymmetric left ventricular hypertrophy of the septal segment. Left ventricular diastolic parameters are indeterminate.   2. Right ventricular systolic function is mildly reduced. The right ventricular  size is normal. There is mildly elevated pulmonary artery systolic pressure. The estimated right ventricular systolic pressure is 43.5 mmHg.   3. Left atrial size was severely dilated.   4. Right atrial size was severely dilated.   5. The mitral valve is abnormal. Mild mitral valve regurgitation.   6. The tricuspid valve is abnormal. Tricuspid valve regurgitation is moderate to severe.   7. The aortic valve is tricuspid. Aortic valve regurgitation is mild. Aortic valve sclerosis is present, with no evidence of aortic valve stenosis.   8. There is mild dilatation of the ascending aorta, measuring 40 mm.   9. The inferior vena cava is dilated in size with <50% respiratory variability, suggesting right atrial pressure of 15 mmHg.   CTA chest.  1. Negative for pulmonary embolus. 2. Congestive heart failure. The possibility superimposed bilateral pneumonia is entertained. 3. Trace ascites. 4. 4.1 cm ascending aortic aneurysm. Follow-up is at the discretion of the referring provider, given patient age. Typically,recommend annual imaging followup by CTA or MRA. This recommendation follows 2010 ACCF/AHA/AATS/ACR/ASA/SCA/SCAI/SIR/STS/SVM Guidelines for the Diagnosis and Management of Patients with Thoracic Aortic Disease. Circulation. 2010; 121: Z733-z630. Aortic aneurysm NOS (ICD10-I71.9). 5. Aortic atherosclerosis (ICD10-I70.0). Coronary artery calcification. 6. Enlarged pulmonic trunk, indicative of pulmonary arterial hypertension. 7.  Emphysema      Disposition Plan  :    Status is: Inpatient  DVT Prophylaxis  :    SCDs Start: 05/25/24 0248 apixaban  (ELIQUIS ) tablet 5 mg    Lab Results  Component Value Date   PLT 142 (L) 05/27/2024    Diet :  Diet Order             Diet full liquid Room service appropriate? Yes; Fluid consistency: Thin  Diet effective now                    Inpatient Medications  Scheduled Meds:  apixaban   5 mg Oral BID   arformoterol   15 mcg Nebulization BID    atorvastatin   20 mg Oral Daily   Chlorhexidine  Gluconate Cloth  6  each Topical Daily   metoprolol  tartrate  25 mg Oral BID   pantoprazole   40 mg Oral Q supper   polyethylene glycol  17 g Oral Daily   revefenacin   175 mcg Nebulization Daily   Continuous Infusions:  cefTRIAXone  (ROCEPHIN )  IV Stopped (05/27/24 1041)   PRN Meds:.diltiazem , docusate, mouth rinse    Objective:   Vitals:   05/28/24 0200 05/28/24 0325 05/28/24 0819 05/28/24 0903  BP: 124/87  116/77   Pulse:   (!) 126 (!) 106  Resp: (!) 23  (!) 22 14  Temp: (!) 97.3 F (36.3 C) 97.9 F (36.6 C) 98.5 F (36.9 C)   TempSrc: Axillary Axillary Axillary   SpO2:   93%   Weight:      Height:        Wt Readings from Last 3 Encounters:  05/27/24 72.5 kg  09/02/23 84.6 kg  02/23/22 81.6 kg     Intake/Output Summary (Last 24 hours) at 05/28/2024 0906 Last data filed at 05/28/2024 0650 Gross per 24 hour  Intake 349.83 ml  Output 1150 ml  Net -800.17 ml     Physical Exam  Elderly weak white male, awake, mildly confused, No new F.N deficits, Normal affect [place].AT,PERRAL Supple Neck, No JVD,   Symmetrical Chest wall movement, Good air movement bilaterally, CTAB iRRR,No Gallops,Rubs or new Murmurs,  +ve B.Sounds, Abd Soft, No tenderness,   Leg weaker than the right internally rotated, apparently chronic since his hip fracture in March      Data Review:    Recent Labs  Lab 05/24/24 1738 05/25/24 0944 05/26/24 0404 05/27/24 0339  WBC 12.4* 11.0* 9.2 7.8  HGB 12.0* 10.8* 10.3* 9.1*  HCT 37.3* 34.2* 33.9* 29.3*  PLT 256 213 154 142*  MCV 93.0 92.9 96.9 94.2  MCH 29.9 29.3 29.4 29.3  MCHC 32.2 31.6 30.4 31.1  RDW 17.4* 17.0* 16.8* 16.7*  LYMPHSABS 1.4  --   --  1.7  MONOABS 0.7  --   --  0.6  EOSABS 0.1  --   --  0.5  BASOSABS 0.1  --   --  0.1    Recent Labs  Lab 05/24/24 1538 05/24/24 1738 05/24/24 1914 05/25/24 0944 05/26/24 0404 05/26/24 0943 05/27/24 0339  NA  --  137  --  140 138  --   137  K  --  3.8  --  4.0 3.9  --  3.6  CL  --  107  --  110 112*  --  104  CO2  --  19*  --  20* 17*  --  21*  ANIONGAP  --  11  --  10 9  --  12  GLUCOSE  --  181*  --  101* 82  --  90  BUN  --  14  --  14 13  --  10  CREATININE  --  0.93  --  0.79 0.77  --  0.76  AST  --  54*  --   --   --   --   --   ALT  --  37  --   --   --   --   --   ALKPHOS  --  71  --   --   --   --   --   BILITOT  --  1.8*  --   --   --   --   --   ALBUMIN   --  2.6*  --   --   --   --   --  PROCALCITON  --   --   --  <0.10  --   --   --   LATICACIDVEN  --  2.9* 2.1* 1.9  --   --   --   INR  --  2.7*  --   --   --   --   --   BNP 479.0*  --   --   --   --  346.4*  --   MG  --   --   --  1.4* 1.5*  --  2.1  PHOS  --   --   --  2.8  --  2.7 3.5  CALCIUM   --  8.8*  --  8.7* 8.4*  --  8.9      Recent Labs  Lab 05/24/24 1538 05/24/24 1738 05/24/24 1914 05/25/24 0944 05/26/24 0404 05/26/24 0943 05/27/24 0339  PROCALCITON  --   --   --  <0.10  --   --   --   LATICACIDVEN  --  2.9* 2.1* 1.9  --   --   --   INR  --  2.7*  --   --   --   --   --   BNP 479.0*  --   --   --   --  346.4*  --   MG  --   --   --  1.4* 1.5*  --  2.1  CALCIUM   --  8.8*  --  8.7* 8.4*  --  8.9    --------------------------------------------------------------------------------------------------------------- No results found for: CHOL, HDL, LDLCALC, LDLDIRECT, TRIG, CHOLHDL  No results found for: HGBA1C No results for input(s): TSH, T4TOTAL, FREET4, T3FREE, THYROIDAB in the last 72 hours. Recent Labs    05/27/24 0413  RETICCTPCT 1.8   Radiology Report DG Chest [facility] 1 View Result Date: 05/28/2024 CLINICAL DATA:  89 year old male with shortness of breath, sepsis. EXAM: PORTABLE CHEST 1 VIEW COMPARISON:  05/25/2024 and earlier. FINDINGS: Portable AP semi upright view at 0601 hours. Extubated. Enteric tube removed. Stable lung volumes. Stable cardiac size and mediastinal contours. Calcified aortic  atherosclerosis. Visualized tracheal air column is within normal limits. Coarse left greater than right perihilar and upper lung predominant pulmonary opacity has not significantly changed from 05/25/2024. Bilateral pleural effusions better demonstrated on 05/26/2024 CTA. No areas of worsening ventilation. Stable visualized osseous structures. Negative visible bowel gas. IMPRESSION: 1. Extubated. Enteric tube removed. 2. Stable lung volumes and ventilation since 05/25/2024, CTA on 05/26/2024 (please see that report). 3. No new cardiopulmonary abnormality. Electronically Signed   By: VEAR Hurst M.D.   On: 05/28/2024 07:07   CT Angio Chest Pulmonary Embolism (PE) W or WO Contrast Result Date: 05/26/2024 CLINICAL DATA:  Pulmonary embolism suspected, high probability. EXAM: CT ANGIOGRAPHY CHEST WITH CONTRAST TECHNIQUE: Multidetector CT imaging of the chest was performed using the standard protocol during bolus administration of intravenous contrast. Multiplanar CT image reconstructions and MIPs were obtained to evaluate the vascular anatomy. RADIATION DOSE REDUCTION: This exam was performed according to the departmental dose-optimization program which includes automated exposure control, adjustment of the mA and/or kV according to patient size and/or use of iterative reconstruction technique. CONTRAST:  75mL OMNIPAQUE  IOHEXOL  350 MG/ML SOLN COMPARISON:  None Available. FINDINGS: Cardiovascular: Negative for pulmonary embolus. Atherosclerotic calcification of the aorta, aortic valve and coronary arteries. Ascending aorta measures 4.1 cm (coronal image 90). Enlarged pulmonic trunk and heart. No pericardial effusion. Mediastinum/Nodes: No pathologically enlarged mediastinal, hilar or axillary lymph nodes. Nasogastric tube in  the esophagus. Lungs/Pleura: Diffuse patchy bilateral ground-glass and scattered consolidation. Emphysema (ICD10-J43.9). Moderate bilateral pleural effusions. Debris in the airway. Endotracheal tube  terminates 2.9 cm above the carina. Upper Abdomen: Patient's arms create streak artifact in the upper abdomen, degrading image quality. Trace ascites. Nasogastric tube terminates in the gastric antrum. Visualized portions of the liver, gallbladder, adrenal glands, kidneys, spleen, pancreas, stomach and bowel are otherwise grossly unremarkable. No upper abdominal adenopathy. Musculoskeletal: Degenerative changes in the spine. Review of the MIP images confirms the above findings. IMPRESSION: 1. Negative for pulmonary embolus. 2. Congestive heart failure. The possibility superimposed bilateral pneumonia is entertained. 3. Trace ascites. 4. 4.1 cm ascending aortic aneurysm. Follow-up is at the discretion of the referring provider, given patient age. Typically,recommend annual imaging followup by CTA or MRA. This recommendation follows 2010 ACCF/AHA/AATS/ACR/ASA/SCA/SCAI/SIR/STS/SVM Guidelines for the Diagnosis and Management of Patients with Thoracic Aortic Disease. Circulation. 2010; 121: Z733-z630. Aortic aneurysm NOS (ICD10-I71.9). 5. Aortic atherosclerosis (ICD10-I70.0). Coronary artery calcification. 6. Enlarged pulmonic trunk, indicative of pulmonary arterial hypertension. 7.  Emphysema (ICD10-J43.9). Electronically Signed   By: Newell Eke M.D.   On: 05/26/2024 11:47     Signature  -   Lavada Stank M.D on 05/28/2024 at 9:06 AM   -  To page go to [email or web address]

## 2024-05-28 NOTE — Progress Notes (Signed)
 Ok to replace k 3.3 with 40 meq x1 per Dr. Dennise.  Sergio Batch, PharmD, BCIDP, AAHIVP, CPP Infectious Disease Pharmacist 05/28/2024 12:04 PM

## 2024-05-28 NOTE — Evaluation (Signed)
 Clinical/Bedside Swallow Evaluation Patient Details  Name: Anthony Fields MRN: [id number] Date of Birth: January 31, 1932  Today's Date: 05/28/2024 Time: SLP Start Time (ACUTE ONLY): 0912 SLP Stop Time (ACUTE ONLY): 0932 SLP Time Calculation (min) (ACUTE ONLY): 20 min  Past Medical History:  Past Medical History:  Diagnosis Date   Acid reflux    Atrial fibrillation (HCC)    Hand deformity, congenital    right hand -fingers   High cholesterol    Hypertension    Past Surgical History:  Past Surgical History:  Procedure Laterality Date   CARDIAC CATHETERIZATION     CORONARY ANGIOPLASTY WITH STENT PLACEMENT     HIP ARTHROSCOPY Left    March 2025   I & D EXTREMITY  11/14/2012   Procedure: IRRIGATION AND DEBRIDEMENT EXTREMITY;  Surgeon: Balinda JAYSON Rogue, MD;  Location: [facility] OR;  Service: Plastics;  Laterality: Right;   HPI:  89 yo male admitted 6/30 with urosepsis and pnA with respiratory decline in ED and intubated. 7/3 extubated. PMHx; hip fx, indwelling foley    Assessment / Plan / Recommendation  Clinical Impression  Pt presents with functional oropharyngeal swallow after intubation.  He was alert, interactive. Oral mechanism exam with no focal CN deficits; absent dentition except one lower incisor.  Pt states he eats well without his dentures in place.  Demonstrated slower but thorough mastication of solids, the appearance of a brisk swallow, and no s/s of aspiration across all tested consistencies. No dysphagia identified. Start regular diet/thin liquids; meds whole with water. No SLP f/u needed. D/W RN, MD.  SLP Visit Diagnosis: Dysphagia, unspecified (R13.10)    Aspiration Risk  No limitations    Diet Recommendation   Thin;Age appropriate regular  Medication Administration: Whole meds with liquid    Other  Recommendations Oral Care Recommendations: Oral care BID       Swallow Study   General Date of Onset: 05/25/24 HPI: 89 yo male admitted 6/30 with urosepsis and pnA with  respiratory decline in ED and intubated. 7/3 extubated. PMHx; hip fx, indwelling foley Type of Study: Bedside Swallow Evaluation Previous Swallow Assessment: no Diet Prior to this Study: Full liquid diet Temperature Spikes Noted: No Respiratory Status: Nasal cannula History of Recent Intubation: Yes Total duration of intubation (days): 3 days Date extubated: 05/27/24 Behavior/Cognition: Alert;Cooperative;Pleasant mood Oral Cavity Assessment: Within Functional Limits Oral Care Completed by SLP: No Oral Cavity - Dentition: Missing dentition Vision: Functional for self-feeding Self-Feeding Abilities: Able to feed self Patient Positioning: Upright in bed Baseline Vocal Quality: Normal Volitional Cough: Strong Volitional Swallow: Able to elicit    Oral/Motor/Sensory Function Overall Oral Motor/Sensory Function: Within functional limits   Ice Chips Ice chips: Within functional limits   Thin Liquid Thin Liquid: Within functional limits    Nectar Thick Nectar Thick Liquid: Not tested   Honey Thick Honey Thick Liquid: Not tested   Puree Puree: Within functional limits   Solid     Solid: Within functional limits      Vona Palma Laurice 05/28/2024,9:40 AM  Palma L. Vona, MA CCC/SLP Clinical Specialist - Acute Care SLP Acute Rehabilitation Services Office number [phone]

## 2024-05-28 NOTE — Progress Notes (Signed)
PHARMACIST - PHYSICIAN COMMUNICATION  DR:   Thedore Mins  CONCERNING: IV to Oral Route Change Policy  RECOMMENDATION: This patient is receiving protonix by the intravenous route.  Based on criteria approved by the Pharmacy and Therapeutics Committee, the intravenous medication(s) is/are being converted to the equivalent oral dose form(s).   DESCRIPTION: These criteria include: The patient is eating (either orally or via tube) and/or has been taking other orally administered medications for a least 24 hours The patient has no evidence of active gastrointestinal bleeding or impaired GI absorption (gastrectomy, short bowel, patient on TNA or NPO).  If you have questions about this conversion, please contact the Pharmacy Department  []   [phone] )  [facility] []   [phone] )  [facility] [x]   [phone] )  [facility] []   [phone] )  [facility] []   [phone] )  [facility]

## 2024-05-28 NOTE — Evaluation (Signed)
 Occupational Therapy Evaluation Patient Details Name: Anthony Fields MRN: [id number] DOB: January 26, 1932 Today's Date: 05/28/2024   History of Present Illness   89 yo male admitted 6/30 with urosepsis and pnA with respiratory decline in ED and intubated. 7/3 extubated. PMHx; hip fx, indwelling foley     Clinical Impressions Pt has had medical set backs since his hip fx in March 2025. He is typically independent. More recently has been able to walk short distances with RW and perform basic ADLs. He has been relying on his family for IADLs. Pt presents with impaired cognition, generalized weakness, decreased activity tolerance and poor standing balance. He needs min to total assist for ADLs. He is able to stand with RW with moderate assistance, unable to take steps. HR to 122 with standing, SpO2 93% on 2L O2 with good pleth. Daughter at bedside and reports decrease in cognition since yesterday, RN notified. Patient will benefit from continued inpatient follow up therapy, <3 hours/day.     If plan is discharge home, recommend the following:   Two people to help with walking and/or transfers;Two people to help with bathing/dressing/bathroom;Assistance with cooking/housework;Direct supervision/assist for medications management;Direct supervision/assist for financial management;Assist for transportation;Help with stairs or ramp for entrance     Functional Status Assessment   Patient has had a recent decline in their functional status and demonstrates the ability to make significant improvements in function in a reasonable and predictable amount of time.     Equipment Recommendations   Other (comment) (defer to next venue)     Recommendations for Other Services         Precautions/Restrictions   Precautions Precautions: Fall;Other (comment) Recall of Precautions/Restrictions: Impaired Precaution/Restrictions Comments: watch sats and HR, left hip internal rotation Restrictions Weight  Bearing Restrictions Per Provider Order: No     Mobility Bed Mobility Overal bed mobility: Needs Assistance Bed Mobility: Supine to Sit, Sit to Supine     Supine to sit: HOB elevated, Used rails, Mod assist Sit to supine: Min assist   General bed mobility comments: assist to raise trunk and to advance hips to EOB, increased time, assist for L LE back into bed    Transfers Overall transfer level: Needs assistance Equipment used: Rolling walker (2 wheels) Transfers: Sit to/from Stand, Bed to chair/wheelchair/BSC Sit to Stand: Mod assist           General transfer comment: assist to rise and steady, unable to take steps      Balance Overall balance assessment: Needs assistance, History of Falls   Sitting balance-Leahy Scale: Fair     Standing balance support: Bilateral upper extremity supported, During functional activity, Reliant on assistive device for balance Standing balance-Leahy Scale: Zero Standing balance comment: RW and mod assist                           ADL either performed or assessed with clinical judgement   ADL Overall ADL's : Needs assistance/impaired Eating/Feeding: Minimal assistance;Bed level   Grooming: Supervision/safety;Wash/dry hands;Wash/dry face;Sitting   Upper Body Bathing: Moderate assistance;Sitting   Lower Body Bathing: Total assistance;+2 for physical assistance;Sit to/from stand   Upper Body Dressing : Minimal assistance;Sitting   Lower Body Dressing: Total assistance;+2 for physical assistance;Sit to/from stand       Toileting- Architect and Hygiene: Total assistance;+2 for physical assistance;Sit to/from stand               Vision Ability to See in Adequate Light:  0 Adequate Patient Visual Report: No change from baseline       Perception         Praxis         Pertinent Vitals/Pain Pain Assessment Pain Assessment: Faces Faces Pain Scale: Hurts little more Pain Location: L hip Pain  Descriptors / Indicators: Discomfort, Guarding Pain Intervention(s): Monitored during session, Repositioned     Extremity/Trunk Assessment Upper Extremity Assessment Upper Extremity Assessment: Left hand dominant;RUE deficits/detail;LUE deficits/detail 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 RUE Coordination: decreased fine motor LUE Deficits / Details: rotator cuff tear with limited shoulder motion grossly 70 degrees flexion LUE Coordination: decreased gross motor   Lower Extremity Assessment Lower Extremity Assessment: Defer to PT evaluation   Cervical / Trunk Assessment Cervical / Trunk Assessment: Kyphotic;Other exceptions (weakness)   Communication Communication Communication: Impaired Factors Affecting Communication: Hearing impaired   Cognition Arousal: Alert Behavior During Therapy: Flat affect Cognition: Cognition impaired   Orientation impairments: Time, Situation Awareness: Intellectual awareness impaired, Online awareness impaired Memory impairment (select all impairments): Short-term memory, Working Civil Service fast streamer, Armed forces training and education officer functioning impairment (select all impairments): Initiation, Problem solving, Reasoning OT - Cognition Comments: pt unaware of inability to ambulate, wants to go home, daughter reports change in cognition today--RN notified                 Following commands: Impaired Following commands impaired: Follows one step commands with increased time     Cueing  General Comments   Cueing Techniques: Verbal cues;Gestural cues;Tactile cues      Exercises     Shoulder Instructions      Home Living Family/patient expects to be discharged to:: Private residence Living Arrangements: Alone Available Help at Discharge: Family;Available PRN/intermittently Type of Home: House Home Access: Ramped entrance     Home Layout: Two level;Able to live on main level with bedroom/bathroom         Bathroom  Toilet: Standard     Home Equipment: Agricultural consultant (2 wheels);Lift chair;BSC/3in1;Wheelchair - manual   Additional Comments: sleeps in lift chair      Prior Functioning/Environment Prior Level of Function : Needs assist             Mobility Comments: walks grossly 40' at a time with RW, WC for community ADLs Comments: can perform basic ADLs, family assist with all IADLs    OT Problem List: Decreased strength;Decreased activity tolerance;Impaired balance (sitting and/or standing);Decreased cognition;Decreased safety awareness;Decreased knowledge of use of DME or AE;Pain   OT Treatment/Interventions: Self-care/ADL training;Neuromuscular education;Therapeutic activities;Cognitive remediation/compensation;Patient/family education;Balance training;DME and/or AE instruction      OT Goals(Current goals can be found in the care plan section)   Acute Rehab OT Goals OT Goal Formulation: With patient Time For Goal Achievement: 06/11/24 Potential to Achieve Goals: Good ADL Goals Pt Will Perform Grooming: with min assist;standing (one activity) Pt Will Transfer to Toilet: with min assist;ambulating;bedside commode Pt Will Perform Toileting - Clothing Manipulation and hygiene: with mod assist;sit to/from stand Additional ADL Goal #1: Pt will complete bed mobility with min assist in preparation for ADLs.   OT Frequency:  Min 2X/week    Co-evaluation              AM-PAC OT 6 Clicks Daily Activity     Outcome Measure Help from another person eating meals?: A Little Help from another person taking care of personal grooming?: A Little Help from another person toileting, which includes using toliet, bedpan, or urinal?: Total Help  from another person bathing (including washing, rinsing, drying)?: A Lot Help from another person to put on and taking off regular upper body clothing?: A Little Help from another person to put on and taking off regular lower body clothing?: Total 6  Click Score: 13   End of Session Equipment Utilized During Treatment: Rolling walker (2 wheels);Gait belt Nurse Communication: Other (comment) (daughter notes cognitive changes, pt wants a cheeseburger)  Activity Tolerance: Patient tolerated treatment well Patient left: in bed;with call bell/phone within reach;with bed alarm set;with family/visitor present  OT Visit Diagnosis: Unsteadiness on feet (R26.81);Muscle weakness (generalized) (M62.81);Other symptoms and signs involving cognitive function;Pain                Time: 8854-8781 OT Time Calculation (min): 33 min Charges:  OT General Charges $OT Visit: 1 Visit OT Evaluation $OT Eval Moderate Complexity: 1 Mod OT Treatments $Self Care/Home Management : 8-22 mins  Mliss HERO, OTR/L Acute Rehabilitation Services Office: [phone]   Kennth Mliss Helling 05/28/2024, 1:29 PM

## 2024-05-29 DIAGNOSIS — J189 Pneumonia, unspecified organism: Secondary | ICD-10-CM | POA: Diagnosis not present

## 2024-05-29 DIAGNOSIS — I4891 Unspecified atrial fibrillation: Secondary | ICD-10-CM | POA: Diagnosis not present

## 2024-05-29 LAB — CBC WITH DIFFERENTIAL/PLATELET
Abs Immature Granulocytes: 0.08 K/uL — ABNORMAL HIGH (ref 0.00–0.07)
Basophils Absolute: 0.1 K/uL (ref 0.0–0.1)
Basophils Relative: 1 %
Eosinophils Absolute: 0.5 K/uL (ref 0.0–0.5)
Eosinophils Relative: 5 %
HCT: 32.6 % — ABNORMAL LOW (ref 39.0–52.0)
Hemoglobin: 11 g/dL — ABNORMAL LOW (ref 13.0–17.0)
Immature Granulocytes: 1 %
Lymphocytes Relative: 24 %
Lymphs Abs: 2.4 K/uL (ref 0.7–4.0)
MCH: 30.6 pg (ref 26.0–34.0)
MCHC: 33.7 g/dL (ref 30.0–36.0)
MCV: 90.8 fL (ref 80.0–100.0)
Monocytes Absolute: 0.9 K/uL (ref 0.1–1.0)
Monocytes Relative: 9 %
Neutro Abs: 6.2 K/uL (ref 1.7–7.7)
Neutrophils Relative %: 60 %
Platelets: 216 K/uL (ref 150–400)
RBC: 3.59 MIL/uL — ABNORMAL LOW (ref 4.22–5.81)
RDW: 18 % — ABNORMAL HIGH (ref 11.5–15.5)
WBC: 10 K/uL (ref 4.0–10.5)
nRBC: 0.3 % — ABNORMAL HIGH (ref 0.0–0.2)

## 2024-05-29 LAB — C-REACTIVE PROTEIN: CRP: 9.9 mg/dL — ABNORMAL HIGH (ref <1.0)

## 2024-05-29 LAB — BASIC METABOLIC PANEL WITH GFR
Anion gap: 9 (ref 5–15)
BUN: 10 mg/dL (ref 8–23)
CO2: 27 mmol/L (ref 22–32)
Calcium: 8.8 mg/dL — ABNORMAL LOW (ref 8.9–10.3)
Chloride: 107 mmol/L (ref 98–111)
Creatinine, Ser: 0.79 mg/dL (ref 0.61–1.24)
GFR, Estimated: >60 mL/min (ref >60)
Glucose, Bld: 92 mg/dL (ref 70–99)
Potassium: 3.4 mmol/L — ABNORMAL LOW (ref 3.5–5.1)
Sodium: 143 mmol/L (ref 135–145)

## 2024-05-29 LAB — CULTURE, BLOOD (ROUTINE X 2)
Culture: NO GROWTH
Culture: NO GROWTH
Special Requests: ADEQUATE

## 2024-05-29 LAB — BRAIN NATRIURETIC PEPTIDE: B Natriuretic Peptide: 892.2 pg/mL — ABNORMAL HIGH (ref 0.0–100.0)

## 2024-05-29 LAB — PROCALCITONIN: Procalcitonin: 0.1 ng/mL

## 2024-05-29 LAB — MAGNESIUM: Magnesium: 1.5 mg/dL — ABNORMAL LOW (ref 1.7–2.4)

## 2024-05-29 LAB — PHOSPHORUS: Phosphorus: 1.6 mg/dL — ABNORMAL LOW (ref 2.5–4.6)

## 2024-05-29 MED ORDER — METOPROLOL TARTRATE 25 MG PO TABS: 25.0000 mg | ORAL_TABLET | Freq: Two times a day (BID) | ORAL | Status: DC

## 2024-05-29 MED ORDER — MIDODRINE HCL 5 MG PO TABS
5.0000 mg | ORAL_TABLET | Freq: Three times a day (TID) | ORAL | Status: DC
  Administered 2024-05-29 – 2024-06-01 (×9): 5 mg via ORAL
  Filled 2024-05-29 (×9): qty 1

## 2024-05-29 MED ORDER — QUETIAPINE FUMARATE 25 MG PO TABS
25.0000 mg | ORAL_TABLET | Freq: Every day | ORAL | Status: DC
  Administered 2024-05-29: 25 mg via ORAL
  Filled 2024-05-29: qty 1

## 2024-05-29 MED ORDER — POTASSIUM CHLORIDE 20 MEQ PO PACK
40.0000 meq | PACK | Freq: Once | ORAL | Status: AC
  Administered 2024-05-29: 40 meq via ORAL
  Filled 2024-05-29: qty 2

## 2024-05-29 MED ORDER — FUROSEMIDE 10 MG/ML IJ SOLN
40.0000 mg | Freq: Every day | INTRAMUSCULAR | Status: DC
  Administered 2024-05-29: 40 mg via INTRAVENOUS
  Filled 2024-05-29: qty 4

## 2024-05-29 MED ORDER — POTASSIUM CHLORIDE CRYS ER 20 MEQ PO TBCR: 40.0000 meq | EXTENDED_RELEASE_TABLET | Freq: Once | ORAL | Status: DC

## 2024-05-29 MED ORDER — METOPROLOL TARTRATE 25 MG PO TABS
25.0000 mg | ORAL_TABLET | Freq: Two times a day (BID) | ORAL | Status: DC
  Filled 2024-05-29: qty 1

## 2024-05-29 MED ORDER — MAGNESIUM SULFATE 4 GM/100ML IV SOLN
4.0000 g | Freq: Once | INTRAVENOUS | Status: AC
  Administered 2024-05-29: 4 g via INTRAVENOUS
  Filled 2024-05-29: qty 100

## 2024-05-29 MED ORDER — HALOPERIDOL LACTATE 5 MG/ML IJ SOLN: 2.0000 mg | Freq: Four times a day (QID) | INTRAMUSCULAR | Status: DC | PRN

## 2024-05-29 NOTE — Progress Notes (Addendum)
 PROGRESS NOTE                                                                                                                                                                                                             Patient Demographics:    Anthony Fields, is a 89 y.o. male, DOB - 08-12-1932, FMW:[id number]  Outpatient Primary MD for the patient is Maree Virgilio SAUNDERS, MD    LOS - 5  Admit date - 05/24/2024    Chief Complaint  Patient presents with   Code Sepsis       Brief Narrative (HPI from H&P)   89 y.o. male who has a PMH of recent left hip fracture in March requiring surgical correction with limited improvement in left leg function since the surgery, neurogenic bladder requiring Foley catheter placement in March during hip surgery, recurrent UTIs related to Foley catheter since then, atrial fibrillation with [place] vas 2 score of greater than 3, dyslipidemia, hypertension.  Patient with above history who was admitted initially in March for hip fracture, subsequently went home with Foley catheter and developed urosepsis in April, he was stabilized and back home, presented to [facility] ER again this time for urosepsis.  He developed CHF/pneumonia causing acute hypoxic respiratory failure requiring intubation, he was subsequently sent to [facility] under the ICU team care, he was initially requiring pressors, stabilized and transferred to my care on 05/28/2024 after his extubation on 05/27/2024.   Subjective:    Patient in bed, appears comfortable, denies any headache, no fever, no chest pain or pressure, no shortness of breath , no abdominal pain. No new focal weakness. Ongoing left leg weakness since his fracture.   Assessment  & Plan :   Recurrent urosepsis in the setting of Foley catheter present on admission.  Foley catheter was changed this admission, responded well to Rocephin  continue to monitor cultures thus far  negative.  Acute hypoxic respiratory failure - unclear etiology, possible pneumonia and acute on chronic systolic CHF echocardiogram noted with EF around 40%, global hypokinesis, required antibiotics and diuretics in ICU along with intubation, responded well to treatment, extubated on 05/27/2024 currently on 4 L nasal cannula oxygen, for now we will continue Rocephin , IV Lasix  as needed.  Speech to follow.  Monitor closely.   Sepsis with possible shock physiology - due to  above initially required pressors now off of it, sepsis pathophysiology improving.   Hx PAF  Currently on oral Lopressor , still in RVR IV Cardizem  as needed added, echo noted with EF 40%, continue home Eliquis , appreciate cardiology input  Dyslipidemia.  On statin along with Tricor .  Essential hypertension.  For now beta-blocker.   Neurogenic bladder with indwelling Foley catheter present on admission.  Supportive care catheter was changed upon this admission, on Ditropan  and Flomax  which will be continued.  Recent left hip fracture.  Surgical correction in March.  Still quite weak, PT OT.  Incidental finding of 4.1 cm descending aortic aneurysm.  Beta-blocker with outpatient follow-up with PCP and cardiothoracic surgery  Hypokalemia, hypophosphatemia and hypomagnesemia.  Replaced.  Acute metabolic encephalopathy.  Hospital-acquired delirium.  Supportive care, low-dose Seroquel , as needed Haldol , minimize narcotics and benzodiazepines.  CT head nonacute, no headache or focal deficits.       Condition - Extremely Guarded  Family Communication  : Daughter Arland [phone] updated over the phone 05/28/2024, 05/29/2024  Code Status : Full code  Consults  : PCCM, cardiology  PUD Prophylaxis : PPI   Procedures  :     CT head and C-spine.  Nonacute.    Echocardiogram.     1. Left ventricular ejection fraction, by estimation, is 45 to 50%. Left ventricular ejection fraction by 2D MOD biplane is 38.7 %. The left  ventricle has mildly decreased function. The left ventricle demonstrates global hypokinesis. There is mild asymmetric left ventricular hypertrophy of the septal segment. Left ventricular diastolic parameters are indeterminate.   2. Right ventricular systolic function is mildly reduced. The right ventricular size is normal. There is mildly elevated pulmonary artery systolic pressure. The estimated right ventricular systolic pressure is 43.5 mmHg.   3. Left atrial size was severely dilated.   4. Right atrial size was severely dilated.   5. The mitral valve is abnormal. Mild mitral valve regurgitation.   6. The tricuspid valve is abnormal. Tricuspid valve regurgitation is moderate to severe.   7. The aortic valve is tricuspid. Aortic valve regurgitation is mild. Aortic valve sclerosis is present, with no evidence of aortic valve stenosis.   8. There is mild dilatation of the ascending aorta, measuring 40 mm.   9. The inferior vena cava is dilated in size with <50% respiratory variability, suggesting right atrial pressure of 15 mmHg.   CTA chest.  1. Negative for pulmonary embolus. 2. Congestive heart failure. The possibility superimposed bilateral pneumonia is entertained. 3. Trace ascites. 4. 4.1 cm ascending aortic aneurysm. Follow-up is at the discretion of the referring provider, given patient age. Typically,recommend annual imaging followup by CTA or MRA. This recommendation follows 2010 ACCF/AHA/AATS/ACR/ASA/SCA/SCAI/SIR/STS/SVM Guidelines for the Diagnosis and Management of Patients with Thoracic Aortic Disease. Circulation. 2010; 121: Z733-z630. Aortic aneurysm NOS (ICD10-I71.9). 5. Aortic atherosclerosis (ICD10-I70.0). Coronary artery calcification. 6. Enlarged pulmonic trunk, indicative of pulmonary arterial hypertension. 7.  Emphysema      Disposition Plan  :    Status is: Inpatient  DVT Prophylaxis  :    SCDs Start: 05/25/24 0248 apixaban  (ELIQUIS ) tablet 5 mg    Lab Results   Component Value Date   PLT 216 05/29/2024    Diet :  Diet Order             DIET SOFT Fluid consistency: Thin  Diet effective now                    Inpatient Medications  Scheduled Meds:  apixaban   5 mg Oral BID   arformoterol   15 mcg Nebulization BID   atorvastatin   20 mg Oral Daily   Chlorhexidine  Gluconate Cloth  6 each Topical Daily   fenofibrate   54 mg Oral Daily   furosemide   40 mg Intravenous Daily   metoprolol  tartrate  25 mg Oral QID   oxybutynin   5 mg Oral BID   pantoprazole   40 mg Oral Q supper   polyethylene glycol  17 g Oral Daily   potassium chloride   40 mEq Oral Once   QUEtiapine   25 mg Oral QHS   revefenacin   175 mcg Nebulization Daily   tamsulosin   0.4 mg Oral QPC supper   Continuous Infusions:  cefTRIAXone  (ROCEPHIN )  IV Stopped (05/28/24 1056)   magnesium  sulfate bolus IVPB 4 g (05/29/24 0856)   PRN Meds:.diltiazem , docusate, haloperidol  lactate, melatonin, mouth rinse    Objective:   Vitals:   05/28/24 2117 05/29/24 0000 05/29/24 0658 05/29/24 0856  BP: 111/62 110/78 109/74   Pulse: (!) 106 (!) 109 (!) 115   Resp: 17 19 15    Temp: 97.6 F (36.4 C) 98.1 F (36.7 C) 97.8 F (36.6 C) 97.6 F (36.4 C)  TempSrc:  Oral Oral Oral  SpO2: 96% 99% 98%   Weight:      Height:        Wt Readings from Last 3 Encounters:  05/27/24 72.5 kg  09/02/23 84.6 kg  02/23/22 81.6 kg     Intake/Output Summary (Last 24 hours) at 05/29/2024 0912 Last data filed at 05/28/2024 2002 Gross per 24 hour  Intake 101.45 ml  Output 2050 ml  Net -1948.55 ml     Physical Exam  Elderly weak white male, awake, mildly confused, No new F.N deficits, Normal affect [place].AT,PERRAL Supple Neck, No JVD,   Symmetrical Chest wall movement, Good air movement bilaterally, CTAB iRRR,No Gallops,Rubs or new Murmurs,  +ve B.Sounds, Abd Soft, No tenderness,   Leg weaker than the right internally rotated, apparently chronic since his hip fracture in March      Data  Review:    Recent Labs  Lab 05/24/24 1738 05/25/24 0944 05/26/24 0404 05/27/24 0339 05/28/24 0824 05/29/24 0404  WBC 12.4* 11.0* 9.2 7.8 9.1 10.0  HGB 12.0* 10.8* 10.3* 9.1* 10.7* 11.0*  HCT 37.3* 34.2* 33.9* 29.3* 32.4* 32.6*  PLT 256 213 154 142* 224 216  MCV 93.0 92.9 96.9 94.2 91.3 90.8  MCH 29.9 29.3 29.4 29.3 30.1 30.6  MCHC 32.2 31.6 30.4 31.1 33.0 33.7  RDW 17.4* 17.0* 16.8* 16.7* 17.3* 18.0*  LYMPHSABS 1.4  --   --  1.7 1.9 2.4  MONOABS 0.7  --   --  0.6 0.7 0.9  EOSABS 0.1  --   --  0.5 0.3 0.5  BASOSABS 0.1  --   --  0.1 0.1 0.1    Recent Labs  Lab 05/24/24 1538 05/24/24 1738 05/24/24 1738 05/24/24 1914 05/25/24 0944 05/26/24 0404 05/26/24 0943 05/27/24 0339 05/28/24 0824 05/29/24 0404 05/29/24 0642  NA  --  137   < >  --  140 138  --  137 141  --  143  K  --  3.8   < >  --  4.0 3.9  --  3.6 3.3*  --  3.4*  CL  --  107   < >  --  110 112*  --  104 103  --  107  CO2  --  19*   < >  --  20* 17*  --  21* 23  --  27  ANIONGAP  --  11   < >  --  10 9  --  12 15  --  9  GLUCOSE  --  181*   < >  --  101* 82  --  90 68*  --  92  BUN  --  14   < >  --  14 13  --  10 11  --  10  CREATININE  --  0.93   < >  --  0.79 0.77  --  0.76 0.68  --  0.79  AST  --  54*  --   --   --   --   --   --   --   --   --   ALT  --  37  --   --   --   --   --   --   --   --   --   ALKPHOS  --  71  --   --   --   --   --   --   --   --   --   BILITOT  --  1.8*  --   --   --   --   --   --   --   --   --   ALBUMIN   --  2.6*  --   --   --   --   --   --   --   --   --   CRP  --   --   --   --   --   --   --   --  11.2* 9.9*  --   PROCALCITON  --   --   --   --  <0.10  --   --   --  <0.10 0.10  --   LATICACIDVEN  --  2.9*  --  2.1* 1.9  --   --   --   --   --   --   INR  --  2.7*  --   --   --   --   --   --   --   --   --   BNP 479.0*  --   --   --   --   --  346.4*  --  838.4* 892.2*  --   MG  --   --   --   --  1.4* 1.5*  --  2.1  --  1.5*  --   PHOS  --   --   --   --  2.8  --   2.7 3.5 2.2* 1.6*  --   CALCIUM   --  8.8*   < >  --  8.7* 8.4*  --  8.9 9.0  --  8.8*   < > = values in this interval not displayed.      Recent Labs  Lab 05/24/24 1538 05/24/24 1738 05/24/24 1738 05/24/24 1914 05/25/24 0944 05/26/24 0404 05/26/24 0943 05/27/24 0339 05/28/24 0824 05/29/24 0404 05/29/24 0642  CRP  --   --   --   --   --   --   --   --  11.2* 9.9*  --   PROCALCITON  --   --   --   --  <0.10  --   --   --  <0.10 0.10  --  LATICACIDVEN  --  2.9*  --  2.1* 1.9  --   --   --   --   --   --   INR  --  2.7*  --   --   --   --   --   --   --   --   --   BNP 479.0*  --   --   --   --   --  346.4*  --  838.4* 892.2*  --   MG  --   --   --   --  1.4* 1.5*  --  2.1  --  1.5*  --   CALCIUM   --  8.8*   < >  --  8.7* 8.4*  --  8.9 9.0  --  8.8*   < > = values in this interval not displayed.    --------------------------------------------------------------------------------------------------------------- No results found for: CHOL, HDL, LDLCALC, LDLDIRECT, TRIG, CHOLHDL  No results found for: HGBA1C No results for input(s): TSH, T4TOTAL, FREET4, T3FREE, THYROIDAB in the last 72 hours. Recent Labs    05/27/24 0413  RETICCTPCT 1.8   Radiology Report DG Chest [facility] 1 View Result Date: 05/28/2024 CLINICAL DATA:  89 year old male with shortness of breath, sepsis. EXAM: PORTABLE CHEST 1 VIEW COMPARISON:  05/25/2024 and earlier. FINDINGS: Portable AP semi upright view at 0601 hours. Extubated. Enteric tube removed. Stable lung volumes. Stable cardiac size and mediastinal contours. Calcified aortic atherosclerosis. Visualized tracheal air column is within normal limits. Coarse left greater than right perihilar and upper lung predominant pulmonary opacity has not significantly changed from 05/25/2024. Bilateral pleural effusions better demonstrated on 05/26/2024 CTA. No areas of worsening ventilation. Stable visualized osseous structures. Negative visible bowel  gas. IMPRESSION: 1. Extubated. Enteric tube removed. 2. Stable lung volumes and ventilation since 05/25/2024, CTA on 05/26/2024 (please see that report). 3. No new cardiopulmonary abnormality. Electronically Signed   By: VEAR Hurst M.D.   On: 05/28/2024 07:07     Signature  -   Lavada Stank M.D on 05/29/2024 at 9:12 AM   -  To page go to [email or web address]

## 2024-05-29 NOTE — Progress Notes (Signed)
 Progress Note  Patient Name: Anthony Fields Date of Encounter: 05/29/2024  Primary Cardiologist:   None   Subjective   He is somnolent but not complaining of pain or SOB.   Inpatient Medications    Scheduled Meds:  apixaban   5 mg Oral BID   arformoterol   15 mcg Nebulization BID   atorvastatin   20 mg Oral Daily   Chlorhexidine  Gluconate Cloth  6 each Topical Daily   fenofibrate   54 mg Oral Daily   furosemide   40 mg Intravenous Daily   metoprolol  tartrate  25 mg Oral QID   oxybutynin   5 mg Oral BID   pantoprazole   40 mg Oral Q supper   polyethylene glycol  17 g Oral Daily   potassium chloride   40 mEq Oral Once   QUEtiapine   25 mg Oral QHS   revefenacin   175 mcg Nebulization Daily   tamsulosin   0.4 mg Oral QPC supper   Continuous Infusions:  cefTRIAXone  (ROCEPHIN )  IV 2 g (05/29/24 0917)   PRN Meds: diltiazem , docusate, haloperidol  lactate, melatonin, mouth rinse   Vital Signs    Vitals:   05/28/24 2117 05/29/24 0000 05/29/24 0658 05/29/24 0856  BP: 111/62 110/78 109/74   Pulse: (!) 106 (!) 109 (!) 115   Resp: 17 19 15    Temp: 97.6 F (36.4 C) 98.1 F (36.7 C) 97.8 F (36.6 C) 97.6 F (36.4 C)  TempSrc:  Oral Oral Oral  SpO2: 96% 99% 98%   Weight:      Height:        Intake/Output Summary (Last 24 hours) at 05/29/2024 1132 Last data filed at 05/28/2024 2002 Gross per 24 hour  Intake 101.45 ml  Output 2050 ml  Net -1948.55 ml   Filed Weights   05/25/24 0200 05/26/24 0500 05/27/24 0356  Weight: 74.6 kg 76.8 kg 72.5 kg    Telemetry    Atrial fib with rate about 100.   - Personally Reviewed  ECG    NA - Personally Reviewed  Physical Exam   GEN:   Chronically ill appearing Neck: No  JVD Cardiac: Irregular RR, no murmurs, rubs, or gallops.  Respiratory:     Decreased breath sounds with scattered wheezing GI: Soft, nontender, non-distended  MS: No  edema; No deformity. Neuro:  Unable to assess.  Patient somnolent Psych:   Unable to assess  Labs     Chemistry Recent Labs  Lab 05/24/24 1738 05/25/24 0944 05/27/24 0339 05/28/24 0824 05/29/24 0642  NA 137   < > 137 141 143  K 3.8   < > 3.6 3.3* 3.4*  CL 107   < > 104 103 107  CO2 19*   < > 21* 23 27  GLUCOSE 181*   < > 90 68* 92  BUN 14   < > 10 11 10   CREATININE 0.93   < > 0.76 0.68 0.79  CALCIUM  8.8*   < > 8.9 9.0 8.8*  PROT 6.0*  --   --   --   --   ALBUMIN  2.6*  --   --   --   --   AST 54*  --   --   --   --   ALT 37  --   --   --   --   ALKPHOS 71  --   --   --   --   BILITOT 1.8*  --   --   --   --   GFRNONAA >60   < > >  60 >60 >60  ANIONGAP 11   < > 12 15 9    < > = values in this interval not displayed.     Hematology Recent Labs  Lab 05/27/24 0339 05/27/24 0413 05/28/24 0824 05/29/24 0404  WBC 7.8  --  9.1 10.0  RBC 3.11* 3.11* 3.55* 3.59*  HGB 9.1*  --  10.7* 11.0*  HCT 29.3*  --  32.4* 32.6*  MCV 94.2  --  91.3 90.8  MCH 29.3  --  30.1 30.6  MCHC 31.1  --  33.0 33.7  RDW 16.7*  --  17.3* 18.0*  PLT 142*  --  224 216    Cardiac EnzymesNo results for input(s): TROPONINI in the last 168 hours. No results for input(s): TROPIPOC in the last 168 hours.   BNP Recent Labs  Lab 05/26/24 0943 05/28/24 0824 05/29/24 0404  BNP 346.4* 838.4* 892.2*     DDimer No results for input(s): DDIMER in the last 168 hours.   Radiology    DG Chest Port 1 View Result Date: 05/28/2024 CLINICAL DATA:  89 year old male with shortness of breath, sepsis. EXAM: PORTABLE CHEST 1 VIEW COMPARISON:  05/25/2024 and earlier. FINDINGS: Portable AP semi upright view at 0601 hours. Extubated. Enteric tube removed. Stable lung volumes. Stable cardiac size and mediastinal contours. Calcified aortic atherosclerosis. Visualized tracheal air column is within normal limits. Coarse left greater than right perihilar and upper lung predominant pulmonary opacity has not significantly changed from 05/25/2024. Bilateral pleural effusions better demonstrated on 05/26/2024 CTA. No areas of  worsening ventilation. Stable visualized osseous structures. Negative visible bowel gas. IMPRESSION: 1. Extubated. Enteric tube removed. 2. Stable lung volumes and ventilation since 05/25/2024, CTA on 05/26/2024 (please see that report). 3. No new cardiopulmonary abnormality. Electronically Signed   By: VEAR Hurst M.D.   On: 05/28/2024 07:07    Cardiac Studies     Echo  1. Left ventricular ejection fraction, by estimation, is 45 to 50%. Left  ventricular ejection fraction by 2D MOD biplane is 38.7 %. The left  ventricle has mildly decreased function. The left ventricle demonstrates  global hypokinesis. There is mild  asymmetric left ventricular hypertrophy of the septal segment. Left  ventricular diastolic parameters are indeterminate.   2. Right ventricular systolic function is mildly reduced. The right  ventricular size is normal. There is mildly elevated pulmonary artery  systolic pressure. The estimated right ventricular systolic pressure is  43.5 mmHg.   3. Left atrial size was severely dilated.   4. Right atrial size was severely dilated.   5. The mitral valve is abnormal. Mild mitral valve regurgitation.   6. The tricuspid valve is abnormal. Tricuspid valve regurgitation is  moderate to severe.   7. The aortic valve is tricuspid. Aortic valve regurgitation is mild.  Aortic valve sclerosis is present, with no evidence of aortic valve  stenosis.   8. There is mild dilatation of the ascending aorta, measuring 40 mm.   9. The inferior vena cava is dilated in size with <50% respiratory  variability, suggesting right atrial pressure of 15 mmHg.   Patient Profile     89 y.o. male  known to have persistent A-fib (diagnosed 1 year ago in [place]), HTN, HLD was transferred from [facility] for septic shock secondary to recurrent urosepsis, mechanically ventilated and on pressors and atrial fibrillation with RVR.   Assessment & Plan    Atrial fibrillation with RVR:   Increased beta blocker  yesterday.  Tolerating Eliquis .  Will consolidate dosing before discharge.     Acute systolic heart failure:   EF as above.  Net negative 1.9 liters.  Continue current therapy with IV diuresis today.  BP low so will not titrate meds further at this time.      For questions or updates, please contact [facility] Please consult [email or web address] for contact info under Cardiology/STEMI.   Signed, Lynwood Schilling, MD  05/29/2024, 11:32 AM

## 2024-05-29 NOTE — Plan of Care (Signed)
   Problem: Education: Goal: Knowledge of General Education information will improve Description: Including pain rating scale, medication(s)/side effects and non-pharmacologic comfort measures Outcome: Progressing   Problem: Activity: Goal: Risk for activity intolerance will decrease Outcome: Progressing   Problem: Nutrition: Goal: Adequate nutrition will be maintained Outcome: Progressing

## 2024-05-30 ENCOUNTER — Inpatient Hospital Stay (HOSPITAL_COMMUNITY)

## 2024-05-30 DIAGNOSIS — I5021 Acute systolic (congestive) heart failure: Secondary | ICD-10-CM | POA: Diagnosis not present

## 2024-05-30 DIAGNOSIS — J9601 Acute respiratory failure with hypoxia: Secondary | ICD-10-CM | POA: Diagnosis not present

## 2024-05-30 DIAGNOSIS — I4891 Unspecified atrial fibrillation: Secondary | ICD-10-CM | POA: Diagnosis not present

## 2024-05-30 DIAGNOSIS — J189 Pneumonia, unspecified organism: Secondary | ICD-10-CM | POA: Diagnosis not present

## 2024-05-30 LAB — BASIC METABOLIC PANEL WITH GFR
Anion gap: 8 (ref 5–15)
BUN: 12 mg/dL (ref 8–23)
CO2: 30 mmol/L (ref 22–32)
Calcium: 8.8 mg/dL — ABNORMAL LOW (ref 8.9–10.3)
Chloride: 105 mmol/L (ref 98–111)
Creatinine, Ser: 0.84 mg/dL (ref 0.61–1.24)
GFR, Estimated: >60 mL/min (ref >60)
Glucose, Bld: 103 mg/dL — ABNORMAL HIGH (ref 70–99)
Potassium: 3.9 mmol/L (ref 3.5–5.1)
Sodium: 143 mmol/L (ref 135–145)

## 2024-05-30 LAB — PROCALCITONIN: Procalcitonin: <0.1 ng/mL

## 2024-05-30 LAB — CBC WITH DIFFERENTIAL/PLATELET
Abs Immature Granulocytes: 0.07 K/uL (ref 0.00–0.07)
Basophils Absolute: 0.1 K/uL (ref 0.0–0.1)
Basophils Relative: 1 %
Eosinophils Absolute: 0.2 K/uL (ref 0.0–0.5)
Eosinophils Relative: 2 %
HCT: 30.7 % — ABNORMAL LOW (ref 39.0–52.0)
Hemoglobin: 10.6 g/dL — ABNORMAL LOW (ref 13.0–17.0)
Immature Granulocytes: 1 %
Lymphocytes Relative: 23 %
Lymphs Abs: 2.1 K/uL (ref 0.7–4.0)
MCH: 31 pg (ref 26.0–34.0)
MCHC: 34.5 g/dL (ref 30.0–36.0)
MCV: 89.8 fL (ref 80.0–100.0)
Monocytes Absolute: 0.8 K/uL (ref 0.1–1.0)
Monocytes Relative: 9 %
Neutro Abs: 6.1 K/uL (ref 1.7–7.7)
Neutrophils Relative %: 64 %
Platelets: 218 K/uL (ref 150–400)
RBC: 3.42 MIL/uL — ABNORMAL LOW (ref 4.22–5.81)
RDW: 18.3 % — ABNORMAL HIGH (ref 11.5–15.5)
WBC: 9.4 K/uL (ref 4.0–10.5)
nRBC: 0.5 % — ABNORMAL HIGH (ref 0.0–0.2)

## 2024-05-30 LAB — MAGNESIUM: Magnesium: 1.9 mg/dL (ref 1.7–2.4)

## 2024-05-30 LAB — C-REACTIVE PROTEIN: CRP: 8.5 mg/dL — ABNORMAL HIGH (ref <1.0)

## 2024-05-30 LAB — BRAIN NATRIURETIC PEPTIDE: B Natriuretic Peptide: 817.6 pg/mL — ABNORMAL HIGH (ref 0.0–100.0)

## 2024-05-30 LAB — PHOSPHORUS: Phosphorus: 2 mg/dL — ABNORMAL LOW (ref 2.5–4.6)

## 2024-05-30 MED ORDER — LACTATED RINGERS IV BOLUS: 500.0000 mL | Freq: Once | INTRAVENOUS | Status: DC

## 2024-05-30 MED ORDER — LACTATED RINGERS IV BOLUS
1000.0000 mL | Freq: Once | INTRAVENOUS | Status: AC
  Administered 2024-05-30: 1000 mL via INTRAVENOUS

## 2024-05-30 MED ORDER — AMIODARONE HCL IN DEXTROSE 360-4.14 MG/200ML-% IV SOLN
60.0000 mg/h | INTRAVENOUS | Status: DC
  Administered 2024-05-30: 60 mg/h via INTRAVENOUS
  Filled 2024-05-30 (×3): qty 200

## 2024-05-30 MED ORDER — POTASSIUM PHOSPHATES 15 MMOLE/5ML IV SOLN
30.0000 mmol | Freq: Once | INTRAVENOUS | Status: AC
  Administered 2024-05-30: 30 mmol via INTRAVENOUS
  Filled 2024-05-30: qty 10

## 2024-05-30 MED ORDER — DIGOXIN 0.25 MG/ML IJ SOLN
0.2500 mg | Freq: Four times a day (QID) | INTRAMUSCULAR | Status: DC
  Administered 2024-05-30: 0.25 mg via INTRAVENOUS
  Filled 2024-05-30: qty 1

## 2024-05-30 MED ORDER — AMIODARONE HCL IN DEXTROSE 360-4.14 MG/200ML-% IV SOLN
30.0000 mg/h | INTRAVENOUS | Status: DC
  Administered 2024-05-30 – 2024-06-01 (×3): 30 mg/h via INTRAVENOUS
  Filled 2024-05-30 (×2): qty 200

## 2024-05-30 MED ORDER — SODIUM CHLORIDE 0.9 % IV SOLN: INTRAVENOUS | Status: AC

## 2024-05-30 MED ORDER — METOPROLOL TARTRATE 25 MG PO TABS: 25.0000 mg | ORAL_TABLET | Freq: Two times a day (BID) | ORAL | Status: DC

## 2024-05-30 MED ORDER — LACTATED RINGERS IV SOLN: INTRAVENOUS | Status: DC

## 2024-05-30 MED ORDER — LACTATED RINGERS IV BOLUS: 1000.0000 mL | Freq: Once | INTRAVENOUS | Status: DC

## 2024-05-30 NOTE — Plan of Care (Signed)
  Problem: Education: Goal: Knowledge of General Education information will improve Description: Including pain rating scale, medication(s)/side effects and non-pharmacologic comfort measures Outcome: Not Progressing   Problem: Health Behavior/Discharge Planning: Goal: Ability to manage health-related needs will improve Outcome: Progressing   Problem: Clinical Measurements: Goal: Ability to maintain clinical measurements within normal limits will improve Outcome: Progressing Goal: Will remain free from infection Outcome: Progressing Goal: Respiratory complications will improve Outcome: Progressing Goal: Cardiovascular complication will be avoided Outcome: Progressing   Problem: Clinical Measurements: Goal: Will remain free from infection Outcome: Progressing   Problem: Clinical Measurements: Goal: Respiratory complications will improve Outcome: Progressing   Problem: Clinical Measurements: Goal: Cardiovascular complication will be avoided Outcome: Progressing   Problem: Nutrition: Goal: Adequate nutrition will be maintained Outcome: Progressing

## 2024-05-30 NOTE — Progress Notes (Addendum)
 PROGRESS NOTE                                                                                                                                                                                                             Patient Demographics:    Anthony Fields, is a 89 y.o. male, DOB - 1932/01/10, FMW:[id number]  Outpatient Primary MD for the patient is Maree Virgilio SAUNDERS, MD    LOS - 6  Admit date - 05/24/2024    Chief Complaint  Patient presents with   Code Sepsis       Brief Narrative (HPI from H&P)   89 y.o. male who has a PMH of recent left hip fracture in March requiring surgical correction with limited improvement in left leg function since the surgery, neurogenic bladder requiring Foley catheter placement in March during hip surgery, recurrent UTIs related to Foley catheter since then, atrial fibrillation with [place] vas 2 score of greater than 3, dyslipidemia, hypertension.  Patient with above history who was admitted initially in March for hip fracture, subsequently went home with Foley catheter and developed urosepsis in April, he was stabilized and back home, presented to [facility] ER again this time for urosepsis.  He developed CHF/pneumonia causing acute hypoxic respiratory failure requiring intubation, he was subsequently sent to [facility] under the ICU team care, he was initially requiring pressors, stabilized and transferred to my care on 05/28/2024 after his extubation on 05/27/2024.   Subjective:   Patient in bed, appears comfortable, denies any headache, no fever, no chest pain or pressure, no shortness of breath , no abdominal pain. No focal weakness.   Assessment  & Plan :   Recurrent urosepsis in the setting of Foley catheter present on admission.  Foley catheter was changed this admission, responded well to Rocephin  continue to monitor cultures thus far negative.  Acute hypoxic respiratory failure - unclear  etiology, possible pneumonia and acute on chronic systolic CHF echocardiogram noted with EF around 40%, global hypokinesis, required antibiotics and diuretics in ICU along with intubation, responded well to treatment, extubated on 05/27/2024 currently on 4 L nasal cannula oxygen, for now we will continue Rocephin , IV Lasix  as needed.  Speech to follow.  Monitor closely.   Sepsis with possible shock physiology - due to above initially required pressors now off of it, sepsis  pathophysiology improving.   Hx PAF blood pressure low, Lopressor  held due to low blood pressures, gentle IV fluid on 05/30/2024, 2 doses of IV digoxin  on 05/30/2024, continue home Eliquis , appreciate cardiology input  Dyslipidemia.  On statin along with Tricor .  Essential hypertension.  Blood pressure soft on 05/30/2024, gentle hydration, holding parameters for beta-blocker and monitor.   Neurogenic bladder with indwelling Foley catheter present on admission.  Supportive care catheter was changed upon this admission, on Ditropan  and Flomax  which will be continued.  Recent left hip fracture.  Surgical correction in March.  Still quite weak, PT OT.  Incidental finding of 4.1 cm descending aortic aneurysm.  Beta-blocker with outpatient follow-up with PCP and cardiothoracic surgery  Hypokalemia, hypophosphatemia and hypomagnesemia.  Replaced.  Acute metabolic encephalopathy.  Hospital-acquired delirium.  Supportive care, low-dose Seroquel , as needed Haldol , minimize narcotics and benzodiazepines.  CT head nonacute, no headache or focal deficits.       Condition - Extremely Guarded  Family Communication  : Daughter Arland [phone] updated over the phone 05/28/2024, 05/29/2024, 05/30/24  Code Status : Full code  Consults  : PCCM, cardiology  PUD Prophylaxis : PPI   Procedures  :     CT head and C-spine.  Nonacute.    Echocardiogram.     1. Left ventricular ejection fraction, by estimation, is 45 to 50%. Left ventricular  ejection fraction by 2D MOD biplane is 38.7 %. The left ventricle has mildly decreased function. The left ventricle demonstrates global hypokinesis. There is mild asymmetric left ventricular hypertrophy of the septal segment. Left ventricular diastolic parameters are indeterminate.   2. Right ventricular systolic function is mildly reduced. The right ventricular size is normal. There is mildly elevated pulmonary artery systolic pressure. The estimated right ventricular systolic pressure is 43.5 mmHg.   3. Left atrial size was severely dilated.   4. Right atrial size was severely dilated.   5. The mitral valve is abnormal. Mild mitral valve regurgitation.   6. The tricuspid valve is abnormal. Tricuspid valve regurgitation is moderate to severe.   7. The aortic valve is tricuspid. Aortic valve regurgitation is mild. Aortic valve sclerosis is present, with no evidence of aortic valve stenosis.   8. There is mild dilatation of the ascending aorta, measuring 40 mm.   9. The inferior vena cava is dilated in size with <50% respiratory variability, suggesting right atrial pressure of 15 mmHg.   CTA chest.  1. Negative for pulmonary embolus. 2. Congestive heart failure. The possibility superimposed bilateral pneumonia is entertained. 3. Trace ascites. 4. 4.1 cm ascending aortic aneurysm. Follow-up is at the discretion of the referring provider, given patient age. Typically,recommend annual imaging followup by CTA or MRA. This recommendation follows 2010 ACCF/AHA/AATS/ACR/ASA/SCA/SCAI/SIR/STS/SVM Guidelines for the Diagnosis and Management of Patients with Thoracic Aortic Disease. Circulation. 2010; 121: Z733-z630. Aortic aneurysm NOS (ICD10-I71.9). 5. Aortic atherosclerosis (ICD10-I70.0). Coronary artery calcification. 6. Enlarged pulmonic trunk, indicative of pulmonary arterial hypertension. 7.  Emphysema      Disposition Plan  :    Status is: Inpatient  DVT Prophylaxis  :    SCDs Start: 05/25/24  0248 apixaban  (ELIQUIS ) tablet 5 mg    Lab Results  Component Value Date   PLT 218 05/30/2024    Diet :  Diet Order             DIET SOFT Fluid consistency: Thin  Diet effective now  Inpatient Medications  Scheduled Meds:  apixaban   5 mg Oral BID   arformoterol   15 mcg Nebulization BID   atorvastatin   20 mg Oral Daily   Chlorhexidine  Gluconate Cloth  6 each Topical Daily   digoxin   0.25 mg Intravenous Q6H   fenofibrate   54 mg Oral Daily   metoprolol  tartrate  25 mg Oral BID   midodrine   5 mg Oral TID WC   oxybutynin   5 mg Oral BID   pantoprazole   40 mg Oral Q supper   polyethylene glycol  17 g Oral Daily   QUEtiapine   25 mg Oral QHS   revefenacin   175 mcg Nebulization Daily   tamsulosin   0.4 mg Oral QPC supper   Continuous Infusions:  cefTRIAXone  (ROCEPHIN )  IV 2 g (05/29/24 0917)   lactated ringers      potassium PHOSPHATE  IVPB (in mmol)     PRN Meds:.diltiazem , docusate, haloperidol  lactate, melatonin, mouth rinse    Objective:   Vitals:   05/30/24 0500 05/30/24 0640 05/30/24 0700 05/30/24 0717  BP: 110/68 (!) 110/59 107/60   Pulse: (!) 118 (!) 119 98 98  Resp:   (!) 22 18  Temp:      TempSrc:      SpO2: 100% 99% (!) 88% 93%  Weight:      Height:        Wt Readings from Last 3 Encounters:  05/30/24 74 kg  09/02/23 84.6 kg  02/23/22 81.6 kg    No intake or output data in the 24 hours ending 05/30/24 0931    Physical Exam  Elderly weak white male, awake, mildly confused, No new F.N deficits, Normal affect [place].AT,PERRAL Supple Neck, No JVD,   Symmetrical Chest wall movement, Good air movement bilaterally, CTAB iRRR,No Gallops,Rubs or new Murmurs,  +ve B.Sounds, Abd Soft, No tenderness,   Leg weaker than the right internally rotated, apparently chronic since his hip fracture in March      Data Review:    Recent Labs  Lab 05/24/24 1738 05/25/24 0944 05/26/24 0404 05/27/24 0339 05/28/24 0824 05/29/24 0404  05/30/24 0433  WBC 12.4*   < > 9.2 7.8 9.1 10.0 9.4  HGB 12.0*   < > 10.3* 9.1* 10.7* 11.0* 10.6*  HCT 37.3*   < > 33.9* 29.3* 32.4* 32.6* 30.7*  PLT 256   < > 154 142* 224 216 218  MCV 93.0   < > 96.9 94.2 91.3 90.8 89.8  MCH 29.9   < > 29.4 29.3 30.1 30.6 31.0  MCHC 32.2   < > 30.4 31.1 33.0 33.7 34.5  RDW 17.4*   < > 16.8* 16.7* 17.3* 18.0* 18.3*  LYMPHSABS 1.4  --   --  1.7 1.9 2.4 2.1  MONOABS 0.7  --   --  0.6 0.7 0.9 0.8  EOSABS 0.1  --   --  0.5 0.3 0.5 0.2  BASOSABS 0.1  --   --  0.1 0.1 0.1 0.1   < > = values in this interval not displayed.    Recent Labs  Lab 05/24/24 1538 05/24/24 1738 05/24/24 1738 05/24/24 1914 05/25/24 0944 05/26/24 0404 05/26/24 9056 05/27/24 0339 05/28/24 0824 05/29/24 0404 05/29/24 0642 05/30/24 0433  NA  --  137   < >  --  140 138  --  137 141  --  143 143  K  --  3.8   < >  --  4.0 3.9  --  3.6 3.3*  --  3.4* 3.9  CL  --  107   < >  --  110 112*  --  104 103  --  107 105  CO2  --  19*   < >  --  20* 17*  --  21* 23  --  27 30  ANIONGAP  --  11   < >  --  10 9  --  12 15  --  9 8  GLUCOSE  --  181*   < >  --  101* 82  --  90 68*  --  92 103*  BUN  --  14   < >  --  14 13  --  10 11  --  10 12  CREATININE  --  0.93   < >  --  0.79 0.77  --  0.76 0.68  --  0.79 0.84  AST  --  54*  --   --   --   --   --   --   --   --   --   --   ALT  --  37  --   --   --   --   --   --   --   --   --   --   ALKPHOS  --  71  --   --   --   --   --   --   --   --   --   --   BILITOT  --  1.8*  --   --   --   --   --   --   --   --   --   --   ALBUMIN   --  2.6*  --   --   --   --   --   --   --   --   --   --   CRP  --   --   --   --   --   --   --   --  11.2* 9.9*  --  8.5*  PROCALCITON  --   --   --   --  <0.10  --   --   --  <0.10 0.10  --  <0.10  LATICACIDVEN  --  2.9*  --  2.1* 1.9  --   --   --   --   --   --   --   INR  --  2.7*  --   --   --   --   --   --   --   --   --   --   BNP 479.0*  --   --   --   --   --  346.4*  --  838.4* 892.2*  --   817.6*  MG  --   --   --   --  1.4* 1.5*  --  2.1  --  1.5*  --  1.9  PHOS  --   --    < >  --  2.8  --  2.7 3.5 2.2* 1.6*  --  2.0*  CALCIUM   --  8.8*   < >  --  8.7* 8.4*  --  8.9 9.0  --  8.8* 8.8*   < > = values in this interval not displayed.      Recent Labs  Lab 05/24/24 1538 05/24/24 1738 05/24/24 1738 05/24/24 1914 05/25/24 0944 05/26/24 0404  05/26/24 9056 05/27/24 9660 05/28/24 0824 05/29/24 0404 05/29/24 9357 05/30/24 0433  CRP  --   --   --   --   --   --   --   --  11.2* 9.9*  --  8.5*  PROCALCITON  --   --   --   --  <0.10  --   --   --  <0.10 0.10  --  <0.10  LATICACIDVEN  --  2.9*  --  2.1* 1.9  --   --   --   --   --   --   --   INR  --  2.7*  --   --   --   --   --   --   --   --   --   --   BNP 479.0*  --   --   --   --   --  346.4*  --  838.4* 892.2*  --  817.6*  MG  --   --   --   --  1.4* 1.5*  --  2.1  --  1.5*  --  1.9  CALCIUM   --  8.8*   < >  --  8.7* 8.4*  --  8.9 9.0  --  8.8* 8.8*   < > = values in this interval not displayed.    --------------------------------------------------------------------------------------------------------------- No results found for: CHOL, HDL, LDLCALC, LDLDIRECT, TRIG, CHOLHDL  No results found for: HGBA1C No results for input(s): TSH, T4TOTAL, FREET4, T3FREE, THYROIDAB in the last 72 hours. No results for input(s): VITAMINB12, FOLATE, FERRITIN, TIBC, IRON, RETICCTPCT in the last 72 hours.  Radiology Report DG Chest Port 1 View Result Date: 05/30/2024 CLINICAL DATA:  Shortness of breath EXAM: PORTABLE CHEST 1 VIEW COMPARISON:  05/28/2024 FINDINGS: Stable cardiac enlargement. Stable lung volumes. Aortic atherosclerotic calcifications. Pleural effusions appear decreased from previous exam. No significant change in the appearance of bilateral interstitial and airspace opacities. Visualized osseous structures appear grossly intact. IMPRESSION: 1. Decreased pleural effusions. 2. No  significant change in the appearance of bilateral interstitial and airspace opacities. Electronically Signed   By: Waddell Calk M.D.   On: 05/30/2024 09:21     Signature  -   Lavada Stank M.D on 05/30/2024 at 9:31 AM   -  To page go to [email or web address]

## 2024-05-30 NOTE — Progress Notes (Addendum)
 Progress Note  Patient Name: Anthony Fields Date of Encounter: 05/30/2024  Primary Cardiologist:   None   Subjective   BP 107/60.  Creatinine 0.84.  I/O's were not recorded yesterday. He is oriented to person only  Inpatient Medications    Scheduled Meds:  apixaban   5 mg Oral BID   arformoterol   15 mcg Nebulization BID   atorvastatin   20 mg Oral Daily   Chlorhexidine  Gluconate Cloth  6 each Topical Daily   digoxin   0.25 mg Intravenous Q6H   fenofibrate   54 mg Oral Daily   metoprolol  tartrate  25 mg Oral BID   midodrine   5 mg Oral TID WC   oxybutynin   5 mg Oral BID   pantoprazole   40 mg Oral Q supper   polyethylene glycol  17 g Oral Daily   QUEtiapine   25 mg Oral QHS   revefenacin   175 mcg Nebulization Daily   tamsulosin   0.4 mg Oral QPC supper   Continuous Infusions:  cefTRIAXone  (ROCEPHIN )  IV 2 g (05/30/24 0956)   potassium PHOSPHATE  IVPB (in mmol)     PRN Meds: diltiazem , docusate, haloperidol  lactate, melatonin, mouth rinse   Vital Signs    Vitals:   05/30/24 0500 05/30/24 0640 05/30/24 0700 05/30/24 0717  BP: 110/68 (!) 110/59 107/60   Pulse: (!) 118 (!) 119 98 98  Resp:   (!) 22 18  Temp:      TempSrc:      SpO2: 100% 99% (!) 88% 93%  Weight:      Height:       No intake or output data in the 24 hours ending 05/30/24 1109  Filed Weights   05/26/24 0500 05/27/24 0356 05/30/24 0403  Weight: 76.8 kg 72.5 kg 74 kg    Telemetry    Atrial fib with rate about 100.   - Personally Reviewed  ECG    NA - Personally Reviewed  Physical Exam   GEN:   Chronically ill appearing Neck: No  JVD Cardiac: Irregular RR, no murmurs, rubs, or gallops.  Respiratory:     Decreased breath sounds  GI: Soft, nontender, non-distended  MS: No  edema; No deformity. Neuro:  Oriented to person only Psych:   Unable to assess  Labs    Chemistry Recent Labs  Lab 05/24/24 1738 05/25/24 0944 05/28/24 0824 05/29/24 0642 05/30/24 0433  NA 137   < > 141 143 143  K  3.8   < > 3.3* 3.4* 3.9  CL 107   < > 103 107 105  CO2 19*   < > 23 27 30   GLUCOSE 181*   < > 68* 92 103*  BUN 14   < > 11 10 12   CREATININE 0.93   < > 0.68 0.79 0.84  CALCIUM  8.8*   < > 9.0 8.8* 8.8*  PROT 6.0*  --   --   --   --   ALBUMIN  2.6*  --   --   --   --   AST 54*  --   --   --   --   ALT 37  --   --   --   --   ALKPHOS 71  --   --   --   --   BILITOT 1.8*  --   --   --   --   GFRNONAA >60   < > >60 >60 >60  ANIONGAP 11   < > 15 9 8    < > =  values in this interval not displayed.     Hematology Recent Labs  Lab 05/28/24 0824 05/29/24 0404 05/30/24 0433  WBC 9.1 10.0 9.4  RBC 3.55* 3.59* 3.42*  HGB 10.7* 11.0* 10.6*  HCT 32.4* 32.6* 30.7*  MCV 91.3 90.8 89.8  MCH 30.1 30.6 31.0  MCHC 33.0 33.7 34.5  RDW 17.3* 18.0* 18.3*  PLT 224 216 218    Cardiac EnzymesNo results for input(s): TROPONINI in the last 168 hours. No results for input(s): TROPIPOC in the last 168 hours.   BNP Recent Labs  Lab 05/28/24 0824 05/29/24 0404 05/30/24 0433  BNP 838.4* 892.2* 817.6*     DDimer No results for input(s): DDIMER in the last 168 hours.   Radiology    DG Chest Port 1 View Result Date: 05/30/2024 CLINICAL DATA:  Shortness of breath EXAM: PORTABLE CHEST 1 VIEW COMPARISON:  05/28/2024 FINDINGS: Stable cardiac enlargement. Stable lung volumes. Aortic atherosclerotic calcifications. Pleural effusions appear decreased from previous exam. No significant change in the appearance of bilateral interstitial and airspace opacities. Visualized osseous structures appear grossly intact. IMPRESSION: 1. Decreased pleural effusions. 2. No significant change in the appearance of bilateral interstitial and airspace opacities. Electronically Signed   By: Waddell Calk M.D.   On: 05/30/2024 09:21    Cardiac Studies     Echo  1. Left ventricular ejection fraction, by estimation, is 45 to 50%. Left  ventricular ejection fraction by 2D MOD biplane is 38.7 %. The left  ventricle has  mildly decreased function. The left ventricle demonstrates  global hypokinesis. There is mild  asymmetric left ventricular hypertrophy of the septal segment. Left  ventricular diastolic parameters are indeterminate.   2. Right ventricular systolic function is mildly reduced. The right  ventricular size is normal. There is mildly elevated pulmonary artery  systolic pressure. The estimated right ventricular systolic pressure is  43.5 mmHg.   3. Left atrial size was severely dilated.   4. Right atrial size was severely dilated.   5. The mitral valve is abnormal. Mild mitral valve regurgitation.   6. The tricuspid valve is abnormal. Tricuspid valve regurgitation is  moderate to severe.   7. The aortic valve is tricuspid. Aortic valve regurgitation is mild.  Aortic valve sclerosis is present, with no evidence of aortic valve  stenosis.   8. There is mild dilatation of the ascending aorta, measuring 40 mm.   9. The inferior vena cava is dilated in size with <50% respiratory  variability, suggesting right atrial pressure of 15 mmHg.   Patient Profile     89 y.o. male  known to have persistent A-fib (diagnosed 1 year ago in [place]), HTN, HLD was transferred from [facility] for septic shock secondary to recurrent urosepsis, mechanically ventilated and on pressors and atrial fibrillation with RVR.   Assessment & Plan    Atrial fibrillation with RVR: Initially presented in septic shock and was in A-fib with RVR.  Improved with treatment of sepsis but remains in RVR -Started metoprolol  25 mg twice daily, but soft BP has limited metoprolol  use. Midodrine  was added.  Digoxin  was added this morning by primary team, but would favor avoiding digoxin  given toxicity risk in elderly patient.  I think amiodarone  is probably our best option at this point given his persistent RVR and low BP, recommend starting amio gtt -Continue Eliquis  5 mg twice daily  Acute systolic heart failure:   Echo this admission shows EF  45%, mild RV dysfunction.  Suspected cardiomyopathy in setting of  sepsis.  Would monitor for improvement as he recovers from acute illness.  He has been being diuresed with IV Lasix .  Appears euvolemic on exam, and given his hypotension would hold off on further lasix  for now  Acute respiratory failure with hypoxia: Possible pneumonia versus systolic heart failure.  He initially required intubation in ICU.  He was extubated on 7/3.  Currently on room air.  Being treated for pneumonia   For questions or updates, please contact [facility] Please consult [email or web address] for contact info under Cardiology/STEMI.   Signed, Lonni LITTIE Nanas, MD  05/30/2024, 11:09 AM

## 2024-05-31 ENCOUNTER — Inpatient Hospital Stay (HOSPITAL_COMMUNITY)

## 2024-05-31 DIAGNOSIS — J189 Pneumonia, unspecified organism: Secondary | ICD-10-CM | POA: Diagnosis not present

## 2024-05-31 LAB — BASIC METABOLIC PANEL WITH GFR
Anion gap: 8 (ref 5–15)
BUN: 11 mg/dL (ref 8–23)
CO2: 27 mmol/L (ref 22–32)
Calcium: 8.3 mg/dL — ABNORMAL LOW (ref 8.9–10.3)
Chloride: 106 mmol/L (ref 98–111)
Creatinine, Ser: 0.64 mg/dL (ref 0.61–1.24)
GFR, Estimated: >60 mL/min (ref >60)
Glucose, Bld: 95 mg/dL (ref 70–99)
Potassium: 3.7 mmol/L (ref 3.5–5.1)
Sodium: 141 mmol/L (ref 135–145)

## 2024-05-31 LAB — CBC WITH DIFFERENTIAL/PLATELET
Abs Immature Granulocytes: 0.07 K/uL (ref 0.00–0.07)
Basophils Absolute: 0.1 K/uL (ref 0.0–0.1)
Basophils Relative: 1 %
Eosinophils Absolute: 0.6 K/uL — ABNORMAL HIGH (ref 0.0–0.5)
Eosinophils Relative: 7 %
HCT: 28.4 % — ABNORMAL LOW (ref 39.0–52.0)
Hemoglobin: 9.3 g/dL — ABNORMAL LOW (ref 13.0–17.0)
Immature Granulocytes: 1 %
Lymphocytes Relative: 24 %
Lymphs Abs: 2.3 K/uL (ref 0.7–4.0)
MCH: 30.1 pg (ref 26.0–34.0)
MCHC: 32.7 g/dL (ref 30.0–36.0)
MCV: 91.9 fL (ref 80.0–100.0)
Monocytes Absolute: 0.7 K/uL (ref 0.1–1.0)
Monocytes Relative: 8 %
Neutro Abs: 5.7 K/uL (ref 1.7–7.7)
Neutrophils Relative %: 59 %
Platelets: 180 K/uL (ref 150–400)
RBC: 3.09 MIL/uL — ABNORMAL LOW (ref 4.22–5.81)
RDW: 18.8 % — ABNORMAL HIGH (ref 11.5–15.5)
WBC: 9.4 K/uL (ref 4.0–10.5)
nRBC: 0 % (ref 0.0–0.2)

## 2024-05-31 LAB — TSH: TSH: 6.882 u[IU]/mL — ABNORMAL HIGH (ref 0.350–4.500)

## 2024-05-31 LAB — PROCALCITONIN: Procalcitonin: <0.1 ng/mL

## 2024-05-31 LAB — BRAIN NATRIURETIC PEPTIDE: B Natriuretic Peptide: 381 pg/mL — ABNORMAL HIGH (ref 0.0–100.0)

## 2024-05-31 LAB — C-REACTIVE PROTEIN: CRP: 6.8 mg/dL — ABNORMAL HIGH (ref <1.0)

## 2024-05-31 LAB — MAGNESIUM: Magnesium: 1.6 mg/dL — ABNORMAL LOW (ref 1.7–2.4)

## 2024-05-31 LAB — PHOSPHORUS: Phosphorus: 2.6 mg/dL (ref 2.5–4.6)

## 2024-05-31 MED ORDER — MAGNESIUM SULFATE 4 GM/100ML IV SOLN
4.0000 g | Freq: Once | INTRAVENOUS | Status: AC
  Administered 2024-05-31: 4 g via INTRAVENOUS
  Filled 2024-05-31: qty 100

## 2024-05-31 MED ORDER — AMOXICILLIN-POT CLAVULANATE 875-125 MG PO TABS
1.0000 | ORAL_TABLET | Freq: Two times a day (BID) | ORAL | Status: DC
  Administered 2024-05-31 – 2024-06-01 (×3): 1 via ORAL
  Filled 2024-05-31 (×3): qty 1

## 2024-05-31 MED ORDER — SODIUM CHLORIDE 0.9 % IV SOLN: INTRAVENOUS | Status: AC

## 2024-05-31 MED ORDER — DILTIAZEM HCL 60 MG PO TABS
60.0000 mg | ORAL_TABLET | Freq: Three times a day (TID) | ORAL | Status: DC
  Administered 2024-05-31 – 2024-06-01 (×2): 60 mg via ORAL
  Filled 2024-05-31 (×2): qty 1

## 2024-05-31 NOTE — Progress Notes (Incomplete)
 Nutrition Follow-up  DOCUMENTATION CODES:  Not applicable  INTERVENTION:     NUTRITION DIAGNOSIS:  Inadequate oral intake related to inability to eat as evidenced by NPO status.   GOAL:  Patient will meet greater than or equal to 90% of their needs  MONITOR:  TF tolerance, Vent status, Labs, I & O's  REASON FOR ASSESSMENT:  Ventilator    ASSESSMENT:  Pt with hx of HTN, HLD, GERD, atrial fibrillation, and recent hip fracture (01/2024) presented to ED with AMS and weakness. Found to be septic on admission.  NUTRITION - FOCUSED PHYSICAL EXAM:  {RD Focused Exam List:21252}  Diet Order:   Diet Order             DIET SOFT Fluid consistency: Thin  Diet effective now             EDUCATION NEEDS:  Not appropriate for education at this time  Skin:  Skin Assessment: Reviewed RN Assessment  Last BM:  6/30  Height:  Ht Readings from Last 1 Encounters:  05/24/24 5' 8 (1.727 m)   Weight:  Wt Readings from Last 1 Encounters:  05/31/24 74.4 kg   Ideal Body Weight:  70 kg  BMI:  Body mass index is 24.94 kg/m.  Estimated Nutritional Needs:   Kcal:  1500-1700 kcal/d  Protein:  75-90g/d  Fluid:  >/=1.5L/d  Blair Deaner MS, RD, LDN Registered Dietitian Clinical Nutrition RD Inpatient Contact Info in Amion

## 2024-05-31 NOTE — Progress Notes (Signed)
 Progress Note  Patient Name: Anthony Fields Date of Encounter: 05/31/2024  Primary Cardiologist:   None Mallipeddi   Subjective   BP low but stable.  Creatinine 0.64.  I/O's were not recorded accurately yesterday. Amiodarone  IV started 7/6. Tolerating well. Family at bedside.   Inpatient Medications    Scheduled Meds:  apixaban   5 mg Oral BID   arformoterol   15 mcg Nebulization BID   atorvastatin   20 mg Oral Daily   Chlorhexidine  Gluconate Cloth  6 each Topical Daily   fenofibrate   54 mg Oral Daily   midodrine   5 mg Oral TID WC   oxybutynin   5 mg Oral BID   pantoprazole   40 mg Oral Q supper   polyethylene glycol  17 g Oral Daily   revefenacin   175 mcg Nebulization Daily   tamsulosin   0.4 mg Oral QPC supper   Continuous Infusions:  amiodarone  30 mg/hr (05/30/24 2229)   cefTRIAXone  (ROCEPHIN )  IV Stopped (05/30/24 1027)   PRN Meds: diltiazem , docusate, haloperidol  lactate, melatonin, mouth rinse   Vital Signs    Vitals:   05/30/24 2000 05/31/24 0000 05/31/24 0300 05/31/24 0500  BP: 105/63 96/69 96/64  110/67  Pulse: (!) 105 98 98 99  Resp: (!) 21 17 20 19   Temp: 98.3 F (36.8 C) 98.3 F (36.8 C) 98.4 F (36.9 C) 97.7 F (36.5 C)  TempSrc: Axillary Axillary Axillary Axillary  SpO2: 97% 100% 100% 100%  Weight:    74.4 kg  Height:        Intake/Output Summary (Last 24 hours) at 05/31/2024 0621 Last data filed at 05/30/2024 1900 Gross per 24 hour  Intake 2035.4 ml  Output 400 ml  Net 1635.4 ml    Filed Weights   05/27/24 0356 05/30/24 0403 05/31/24 0500  Weight: 72.5 kg 74 kg 74.4 kg    Telemetry    Atrial fib with rate about 90-110.   - Personally Reviewed  ECG    NA - Personally Reviewed  Physical Exam   GEN:   Chronically ill appearing Neck: No  JVD Cardiac: Irregular RR, no murmurs, rubs, or gallops.  Respiratory:     Decreased breath sounds  GI: Soft, nontender, non-distended  MS: No  edema; No deformity. Neuro:  overall nonfocal Psych:    Unable to assess, speech not clear, family feels he is at baseline.   Labs    Chemistry Recent Labs  Lab 05/24/24 1738 05/25/24 0944 05/29/24 0642 05/30/24 0433 05/31/24 0519  NA 137   < > 143 143 141  K 3.8   < > 3.4* 3.9 3.7  CL 107   < > 107 105 106  CO2 19*   < > 27 30 27   GLUCOSE 181*   < > 92 103* 95  BUN 14   < > 10 12 11   CREATININE 0.93   < > 0.79 0.84 0.64  CALCIUM  8.8*   < > 8.8* 8.8* 8.3*  PROT 6.0*  --   --   --   --   ALBUMIN  2.6*  --   --   --   --   AST 54*  --   --   --   --   ALT 37  --   --   --   --   ALKPHOS 71  --   --   --   --   BILITOT 1.8*  --   --   --   --   GFRNONAA >60   < > >  60 >60 >60  ANIONGAP 11   < > 9 8 8    < > = values in this interval not displayed.     Hematology Recent Labs  Lab 05/29/24 0404 05/30/24 0433 05/31/24 0519  WBC 10.0 9.4 9.4  RBC 3.59* 3.42* 3.09*  HGB 11.0* 10.6* 9.3*  HCT 32.6* 30.7* 28.4*  MCV 90.8 89.8 91.9  MCH 30.6 31.0 30.1  MCHC 33.7 34.5 32.7  RDW 18.0* 18.3* 18.8*  PLT 216 218 180    Cardiac EnzymesNo results for input(s): TROPONINI in the last 168 hours. No results for input(s): TROPIPOC in the last 168 hours.   BNP Recent Labs  Lab 05/29/24 0404 05/30/24 0433 05/31/24 0519  BNP 892.2* 817.6* 381.0*     DDimer No results for input(s): DDIMER in the last 168 hours.   Radiology    DG Chest Port 1 View Result Date: 05/30/2024 CLINICAL DATA:  Shortness of breath EXAM: PORTABLE CHEST 1 VIEW COMPARISON:  05/28/2024 FINDINGS: Stable cardiac enlargement. Stable lung volumes. Aortic atherosclerotic calcifications. Pleural effusions appear decreased from previous exam. No significant change in the appearance of bilateral interstitial and airspace opacities. Visualized osseous structures appear grossly intact. IMPRESSION: 1. Decreased pleural effusions. 2. No significant change in the appearance of bilateral interstitial and airspace opacities. Electronically Signed   By: Waddell Calk M.D.    On: 05/30/2024 09:21    Cardiac Studies     Echo  1. Left ventricular ejection fraction, by estimation, is 45 to 50%. Left  ventricular ejection fraction by 2D MOD biplane is 38.7 %. The left  ventricle has mildly decreased function. The left ventricle demonstrates  global hypokinesis. There is mild  asymmetric left ventricular hypertrophy of the septal segment. Left  ventricular diastolic parameters are indeterminate.   2. Right ventricular systolic function is mildly reduced. The right  ventricular size is normal. There is mildly elevated pulmonary artery  systolic pressure. The estimated right ventricular systolic pressure is  43.5 mmHg.   3. Left atrial size was severely dilated.   4. Right atrial size was severely dilated.   5. The mitral valve is abnormal. Mild mitral valve regurgitation.   6. The tricuspid valve is abnormal. Tricuspid valve regurgitation is  moderate to severe.   7. The aortic valve is tricuspid. Aortic valve regurgitation is mild.  Aortic valve sclerosis is present, with no evidence of aortic valve  stenosis.   8. There is mild dilatation of the ascending aorta, measuring 40 mm.   9. The inferior vena cava is dilated in size with <50% respiratory  variability, suggesting right atrial pressure of 15 mmHg.   Patient Profile     89 y.o. male  known to have persistent A-fib (diagnosed 1 year ago in [place]), HTN, HLD was transferred from [facility] for septic shock secondary to recurrent urosepsis, mechanically ventilated and on pressors and atrial fibrillation with RVR.   Assessment & Plan    Atrial fibrillation with RVR: Initially presented in septic shock and was in A-fib with RVR.  Improved with treatment of sepsis but remains in RVR -Started metoprolol  25 mg twice daily, but soft BP has limited metoprolol  use. Midodrine  was added.  Digoxin  was added by primary team, but would favor avoiding digoxin  given toxicity risk in elderly patient.   - amio gtt  continues started 7/6 -Continue Eliquis  5 mg twice daily  Acute systolic heart failure:   Echo this admission shows EF 45%, mild RV dysfunction.  Suspected cardiomyopathy in setting  of sepsis.  Would monitor for improvement as he recovers from acute illness.  He has been being diuresed with IV Lasix .  Appears euvolemic on exam, and given his hypotension would hold off on further lasix  for now  Acute respiratory failure with hypoxia: Possible pneumonia versus systolic heart failure.  He initially required intubation in ICU.  He was extubated on 7/3.  Currently on room air.  Being treated for pneumonia. Aspirated again this am.   For questions or updates, please contact [facility] Please consult [email or web address] for contact info under Cardiology/STEMI.   Signed, Soyla DELENA Merck, MD  05/31/2024, 6:21 AM

## 2024-05-31 NOTE — Progress Notes (Addendum)
 Physical Therapy Treatment Patient Details Name: Anthony Fields MRN: [id number] DOB: 1932/10/12 Today's Date: 05/31/2024   History of Present Illness 89 yo male admitted 6/30 with urosepsis and pnA with respiratory decline in ED and intubated. 7/3 extubated. PMHx; hip fx, indwelling foley    PT Comments  Pt tolerated treatment well today. Pt today was able to transfer to chair with +2 Min A HHA. No change in DC/DME recs at this time. PT will continue to follow.     If plan is discharge home, recommend the following: A lot of help with walking and/or transfers;A lot of help with bathing/dressing/bathroom;Assistance with cooking/housework;Assist for transportation;Help with stairs or ramp for entrance   Can travel by private vehicle     No  Equipment Recommendations  None recommended by PT    Recommendations for Other Services       Precautions / Restrictions Precautions Precautions: Fall;Other (comment) Recall of Precautions/Restrictions: Impaired Precaution/Restrictions Comments: watch sats and HR, left hip internal rotation Restrictions Weight Bearing Restrictions Per Provider Order: No     Mobility  Bed Mobility Overal bed mobility: Needs Assistance Bed Mobility: Supine to Sit     Supine to sit: HOB elevated, Used rails, Mod assist     General bed mobility comments: Mod A to scoot hips forwards and for BLE management    Transfers Overall transfer level: Needs assistance Equipment used: 2 person hand held assist Transfers: Sit to/from Stand, Bed to chair/wheelchair/BSC Sit to Stand: +2 physical assistance, Min assist   Step pivot transfers: +2 physical assistance, Min assist       General transfer comment: assist to rise and steady. +2 Min A to step pivot to chair. Pt noted with high fear of falling however was able to take very short steps to chair.    Ambulation/Gait               General Gait Details: not yet able   Stairs             Wheelchair  Mobility     Tilt Bed    Modified Rankin (Stroke Patients Only)       Balance Overall balance assessment: Needs assistance, History of Falls Sitting-balance support: No upper extremity supported, Feet supported Sitting balance-Leahy Scale: Fair     Standing balance support: Bilateral upper extremity supported, During functional activity, Reliant on assistive device for balance Standing balance-Leahy Scale: Zero Standing balance comment: Reliant on external support                            Communication Communication Communication: Impaired Factors Affecting Communication: Hearing impaired  Cognition Arousal: Alert Behavior During Therapy: Flat affect   PT - Cognitive impairments: Safety/Judgement                       PT - Cognition Comments: cues for safety and lines Following commands: Impaired Following commands impaired: Follows one step commands with increased time    Cueing Cueing Techniques: Verbal cues, Gestural cues, Tactile cues  Exercises General Exercises - Lower Extremity Long Arc Quad: AROM, Both, 10 reps, Seated Hip Flexion/Marching: AROM, Both, 10 reps, Seated    General Comments General comments (skin integrity, edema, etc.): VSS on 2L      Pertinent Vitals/Pain Pain Assessment Pain Assessment: No/denies pain    Home Living  Prior Function            PT Goals (current goals can now be found in the care plan section) Progress towards PT goals: Progressing toward goals    Frequency    Min 2X/week      PT Plan      Co-evaluation              AM-PAC PT 6 Clicks Mobility   Outcome Measure  Help needed turning from your back to your side while in a flat bed without using bedrails?: A Little Help needed moving from lying on your back to sitting on the side of a flat bed without using bedrails?: A Little Help needed moving to and from a bed to a chair (including a  wheelchair)?: A Little Help needed standing up from a chair using your arms (e.g., wheelchair or bedside chair)?: A Lot Help needed to walk in hospital room?: A Lot Help needed climbing 3-5 steps with a railing? : Total 6 Click Score: 14    End of Session Equipment Utilized During Treatment: Gait belt;Oxygen Activity Tolerance: Patient tolerated treatment well Patient left: in chair;with call bell/phone within reach;with family/visitor present;with chair alarm set Nurse Communication: Mobility status PT Visit Diagnosis: Other abnormalities of gait and mobility (R26.89);Difficulty in walking, not elsewhere classified (R26.2);Muscle weakness (generalized) (M62.81)     Time: 8499-8484 PT Time Calculation (min) (ACUTE ONLY): 15 min  Charges:    $Therapeutic Activity: 8-22 mins PT General Charges $$ ACUTE PT VISIT: 1 Visit                     Trang Bouse B, PT, DPT Acute Rehab Services [phone]    Zafar Debrosse 05/31/2024, 3:36 PM

## 2024-05-31 NOTE — Progress Notes (Addendum)
 PROGRESS NOTE                                                                                                                                                                                                             Patient Demographics:    Anthony Fields, is a 89 y.o. male, DOB - [birth year], FMW:[id number]  Outpatient Primary MD for the patient is Maree Virgilio SAUNDERS, MD    LOS - 7  Admit date - 05/24/2024    Chief Complaint  Patient presents with   Code Sepsis       Brief Narrative (HPI from H&P)   89 y.o. male who has a PMH of recent left hip fracture in March requiring surgical correction with limited improvement in left leg function since the surgery, neurogenic bladder requiring Foley catheter placement in March during hip surgery, recurrent UTIs related to Foley catheter since then, atrial fibrillation with [place] vas 2 score of greater than 3, dyslipidemia, hypertension.  Patient with above history who was admitted initially in March for hip fracture, subsequently went home with Foley catheter and developed urosepsis in April, he was stabilized and back home, presented to [facility] ER again this time for urosepsis.  He developed CHF/pneumonia causing acute hypoxic respiratory failure requiring intubation, he was subsequently sent to [facility] under the ICU team care, he was initially requiring pressors, stabilized and transferred to my care on 05/28/2024 after his extubation on 05/27/2024.   Subjective:   Patient in bed, appears comfortable, denies any headache, no fever, no chest pain or pressure, no shortness of breath , no abdominal pain. No new focal weakness.  Choked eating breakfast, was taking a very large bite, counseled.   Assessment  & Plan :   Recurrent urosepsis in the setting of Foley catheter present on admission.  Foley catheter was changed this admission, responded well to Rocephin  finished 7-day course.   Continue to monitor cultures thus far negative.  Acute hypoxic respiratory failure - unclear etiology, possible pneumonia and acute on chronic systolic CHF echocardiogram noted with EF around 40%, global hypokinesis, required antibiotics and diuretics in ICU along with intubation, responded well to treatment, extubated on 05/27/2024 currently on 4 L nasal cannula oxygen, patient from Rocephin  to Augmentin  on 05/31/2024, IV Lasix  as needed.  Speech to follow.  Monitor closely.  Sepsis with possible shock physiology - due to above initially required pressors now off of it, sepsis pathophysiology improving.   Hx PAF blood pressure low, Lopressor  held due to low blood pressures, placed on amiodarone  drip by cardiology on 05/30/2024, gentle hydration with IV fluids on 05/31/2024 again as he still appears to be slightly dry, continue home Eliquis , appreciate cardiology input  Dyslipidemia.  On statin along with Tricor .  Essential hypertension.  Blood pressure soft on 05/30/2024, gentle hydration, holding parameters for beta-blocker and monitor.   Neurogenic bladder with indwelling Foley catheter present on admission.  Supportive care catheter was changed upon this admission, on Ditropan  and Flomax  which will be continued.  Recent left hip fracture.  Surgical correction in March.  Still quite weak, PT OT.  Incidental finding of 4.1 cm descending aortic aneurysm.  Beta-blocker with outpatient follow-up with PCP and cardiothoracic surgery  Hypokalemia, hypophosphatemia and hypomagnesemia.  Replaced.  Acute metabolic encephalopathy.  Hospital-acquired delirium.  Supportive care, low-dose Seroquel , as needed Haldol , minimize narcotics and benzodiazepines.  CT head nonacute, no headache or focal deficits.  Choking episode eating a large bite of breakfast.  Counseled to take small bites, repeat chest x-ray, Augmentin  on 05/31/2024 and monitor.       Condition - Extremely Guarded  Family Communication  :  Daughter Arland [phone] updated over the phone 05/28/2024, 05/29/2024, 05/30/24, daughter bedside 05/31/2024  Code Status : Full code  Consults  : PCCM, cardiology  PUD Prophylaxis : PPI   Procedures  :     CT head and C-spine.  Nonacute.    Echocardiogram.     1. Left ventricular ejection fraction, by estimation, is 45 to 50%. Left ventricular ejection fraction by 2D MOD biplane is 38.7 %. The left ventricle has mildly decreased function. The left ventricle demonstrates global hypokinesis. There is mild asymmetric left ventricular hypertrophy of the septal segment. Left ventricular diastolic parameters are indeterminate.   2. Right ventricular systolic function is mildly reduced. The right ventricular size is normal. There is mildly elevated pulmonary artery systolic pressure. The estimated right ventricular systolic pressure is 43.5 mmHg.   3. Left atrial size was severely dilated.   4. Right atrial size was severely dilated.   5. The mitral valve is abnormal. Mild mitral valve regurgitation.   6. The tricuspid valve is abnormal. Tricuspid valve regurgitation is moderate to severe.   7. The aortic valve is tricuspid. Aortic valve regurgitation is mild. Aortic valve sclerosis is present, with no evidence of aortic valve stenosis.   8. There is mild dilatation of the ascending aorta, measuring 40 mm.   9. The inferior vena cava is dilated in size with <50% respiratory variability, suggesting right atrial pressure of 15 mmHg.   CTA chest.  1. Negative for pulmonary embolus. 2. Congestive heart failure. The possibility superimposed bilateral pneumonia is entertained. 3. Trace ascites. 4. 4.1 cm ascending aortic aneurysm. Follow-up is at the discretion of the referring provider, given patient age. Typically,recommend annual imaging followup by CTA or MRA. This recommendation follows 2010 ACCF/AHA/AATS/ACR/ASA/SCA/SCAI/SIR/STS/SVM Guidelines for the Diagnosis and Management of Patients with Thoracic  Aortic Disease. Circulation. 2010; 121: Z733-z630. Aortic aneurysm NOS (ICD10-I71.9). 5. Aortic atherosclerosis (ICD10-I70.0). Coronary artery calcification. 6. Enlarged pulmonic trunk, indicative of pulmonary arterial hypertension. 7.  Emphysema      Disposition Plan  :    Status is: Inpatient  DVT Prophylaxis  :    SCDs Start: 05/25/24 0248 apixaban  (ELIQUIS ) tablet 5 mg    Lab  Results  Component Value Date   PLT 180 05/31/2024    Diet :  Diet Order             DIET SOFT Fluid consistency: Thin  Diet effective now                    Inpatient Medications  Scheduled Meds:  apixaban   5 mg Oral BID   arformoterol   15 mcg Nebulization BID   atorvastatin   20 mg Oral Daily   Chlorhexidine  Gluconate Cloth  6 each Topical Daily   fenofibrate   54 mg Oral Daily   midodrine   5 mg Oral TID WC   oxybutynin   5 mg Oral BID   pantoprazole   40 mg Oral Q supper   polyethylene glycol  17 g Oral Daily   revefenacin   175 mcg Nebulization Daily   tamsulosin   0.4 mg Oral QPC supper   Continuous Infusions:  sodium chloride      amiodarone  30 mg/hr (05/30/24 2229)   cefTRIAXone  (ROCEPHIN )  IV Stopped (05/30/24 1027)   magnesium  sulfate bolus IVPB     PRN Meds:.diltiazem , docusate, haloperidol  lactate, melatonin, mouth rinse    Objective:   Vitals:   05/30/24 2000 05/31/24 0000 05/31/24 0300 05/31/24 0500  BP: 105/63 96/69 96/64  110/67  Pulse: (!) 105 98 98 99  Resp: (!) 21 17 20 19   Temp: 98.3 F (36.8 C) 98.3 F (36.8 C) 98.4 F (36.9 C) 97.7 F (36.5 C)  TempSrc: Axillary Axillary Axillary Axillary  SpO2: 97% 100% 100% 100%  Weight:    74.4 kg  Height:        Wt Readings from Last 3 Encounters:  05/31/24 74.4 kg  09/02/23 84.6 kg  02/23/22 81.6 kg     Intake/Output Summary (Last 24 hours) at 05/31/2024 0843 Last data filed at 05/30/2024 1900 Gross per 24 hour  Intake 1932.91 ml  Output 400 ml  Net 1532.91 ml      Physical Exam  Elderly weak white  male, awake, mildly confused, No new F.N deficits, Normal affect [place].AT,PERRAL Supple Neck, No JVD,   Symmetrical Chest wall movement, Good air movement bilaterally, CTAB iRRR,No Gallops,Rubs or new Murmurs,  +ve B.Sounds, Abd Soft, No tenderness,   Leg weaker than the right internally rotated, apparently chronic since his hip fracture in March      Data Review:    Recent Labs  Lab 05/27/24 0339 05/28/24 0824 05/29/24 0404 05/30/24 0433 05/31/24 0519  WBC 7.8 9.1 10.0 9.4 9.4  HGB 9.1* 10.7* 11.0* 10.6* 9.3*  HCT 29.3* 32.4* 32.6* 30.7* 28.4*  PLT 142* 224 216 218 180  MCV 94.2 91.3 90.8 89.8 91.9  MCH 29.3 30.1 30.6 31.0 30.1  MCHC 31.1 33.0 33.7 34.5 32.7  RDW 16.7* 17.3* 18.0* 18.3* 18.8*  LYMPHSABS 1.7 1.9 2.4 2.1 2.3  MONOABS 0.6 0.7 0.9 0.8 0.7  EOSABS 0.5 0.3 0.5 0.2 0.6*  BASOSABS 0.1 0.1 0.1 0.1 0.1    Recent Labs  Lab 05/24/24 1738 05/24/24 1738 05/24/24 1914 05/25/24 0944 05/26/24 0404 05/26/24 0943 05/27/24 0339 05/28/24 0824 05/29/24 0404 05/29/24 0642 05/30/24 0433 05/31/24 0519  NA 137  --   --  140 138  --  137 141  --  143 143 141  K 3.8  --   --  4.0 3.9  --  3.6 3.3*  --  3.4* 3.9 3.7  CL 107  --   --  110 112*  --  104 103  --  107 105 106  CO2 19*  --   --  20* 17*  --  21* 23  --  27 30 27   ANIONGAP 11  --   --  10 9  --  12 15  --  9 8 8   GLUCOSE 181*  --   --  101* 82  --  90 68*  --  92 103* 95  BUN 14  --   --  14 13  --  10 11  --  10 12 11   CREATININE 0.93  --   --  0.79 0.77  --  0.76 0.68  --  0.79 0.84 0.64  AST 54*  --   --   --   --   --   --   --   --   --   --   --   ALT 37  --   --   --   --   --   --   --   --   --   --   --   ALKPHOS 71  --   --   --   --   --   --   --   --   --   --   --   BILITOT 1.8*  --   --   --   --   --   --   --   --   --   --   --   ALBUMIN  2.6*  --   --   --   --   --   --   --   --   --   --   --   CRP  --   --   --   --   --   --   --  11.2* 9.9*  --  8.5* 6.8*  PROCALCITON  --   --   --   <0.10  --   --   --  <0.10 0.10  --  <0.10 <0.10  LATICACIDVEN 2.9*  --  2.1* 1.9  --   --   --   --   --   --   --   --   INR 2.7*  --   --   --   --   --   --   --   --   --   --   --   TSH  --   --   --   --   --   --   --   --   --   --   --  6.882*  BNP  --   --   --   --   --  346.4*  --  838.4* 892.2*  --  817.6* 381.0*  MG  --    < >  --  1.4* 1.5*  --  2.1  --  1.5*  --  1.9 1.6*  PHOS  --    < >  --  2.8  --  2.7 3.5 2.2* 1.6*  --  2.0* 2.6  CALCIUM  8.8*  --   --  8.7* 8.4*  --  8.9 9.0  --  8.8* 8.8* 8.3*   < > = values in this interval not displayed.      Recent Labs  Lab 05/24/24 1738 05/24/24 1738 05/24/24 1914 05/25/24 9055 05/26/24 0404 05/26/24 9056 05/27/24 9660 05/28/24 9175 05/29/24 0404 05/29/24 9357 05/30/24 9566  05/31/24 0519  CRP  --   --   --   --   --   --   --  11.2* 9.9*  --  8.5* 6.8*  PROCALCITON  --   --   --  <0.10  --   --   --  <0.10 0.10  --  <0.10 <0.10  LATICACIDVEN 2.9*  --  2.1* 1.9  --   --   --   --   --   --   --   --   INR 2.7*  --   --   --   --   --   --   --   --   --   --   --   TSH  --   --   --   --   --   --   --   --   --   --   --  6.882*  BNP  --   --   --   --   --  346.4*  --  838.4* 892.2*  --  817.6* 381.0*  MG  --    < >  --  1.4* 1.5*  --  2.1  --  1.5*  --  1.9 1.6*  CALCIUM  8.8*  --   --  8.7* 8.4*  --  8.9 9.0  --  8.8* 8.8* 8.3*   < > = values in this interval not displayed.    --------------------------------------------------------------------------------------------------------------- No results found for: CHOL, HDL, LDLCALC, LDLDIRECT, TRIG, CHOLHDL  No results found for: HGBA1C Recent Labs    05/31/24 0519  TSH 6.882*   No results for input(s): VITAMINB12, FOLATE, FERRITIN, TIBC, IRON, RETICCTPCT in the last 72 hours.  Radiology Report DG Chest Port 1 View Result Date: 05/30/2024 CLINICAL DATA:  Shortness of breath EXAM: PORTABLE CHEST 1 VIEW COMPARISON:  05/28/2024 FINDINGS:  Stable cardiac enlargement. Stable lung volumes. Aortic atherosclerotic calcifications. Pleural effusions appear decreased from previous exam. No significant change in the appearance of bilateral interstitial and airspace opacities. Visualized osseous structures appear grossly intact. IMPRESSION: 1. Decreased pleural effusions. 2. No significant change in the appearance of bilateral interstitial and airspace opacities. Electronically Signed   By: Waddell Calk M.D.   On: 05/30/2024 09:21     Signature  -   Lavada Stank M.D on 05/31/2024 at 8:43 AM   -  To page go to [email or web address]

## 2024-05-31 NOTE — TOC Progression Note (Signed)
 Transition of Care [facility]) - Progression Note    Patient Details  Name: Anthony Fields MRN: [id number] Date of Birth: [birth year]  [facility] CM/SW Contact  Robynn Eileen Hoose, RN Phone Number: 05/31/2024, 4:31 PM  Clinical Narrative:   Spoke with daughter about [facility] agency choice. Daughter reports patient is with [facility] and wishes to continue with them. Message sent to [facility] with [facility] to confirm current services. Awaiting response.    Expected Discharge Plan: Skilled Nursing Facility Barriers to Discharge: Continued Medical Work up, SNF Pending bed offer  Expected Discharge Plan and Services In-house Referral: Clinical Social Work   Post Acute Care Choice: Skilled Nursing Facility Living arrangements for the past 2 months: Single Family Home                                       Social Determinants of Health (SDOH) Interventions SDOH Screenings   Tobacco Use: Medium Risk (09/02/2023)    Readmission Risk Interventions     No data to display

## 2024-05-31 NOTE — Plan of Care (Signed)

## 2024-05-31 NOTE — TOC Progression Note (Signed)
 Transition of Care [facility]) - Progression Note    Patient Details  Name: Anthony Fields MRN: [id number] Date of Birth: 09/24/1932  Transition of Care [facility]) CM/SW Contact  Anju Sereno LITTIE Moose, LCSW Phone Number: 05/31/2024, 3:54 PM  Clinical Narrative:    CSW spoke with pt daughter, Arland, about pt d/c plan. Arland stated she would prefer to take her father home instead of to a SNF. CSW will update RNCM.    Expected Discharge Plan: Skilled Nursing Facility Barriers to Discharge: Continued Medical Work up, SNF Pending bed offer  Expected Discharge Plan and Services In-house Referral: Clinical Social Work   Post Acute Care Choice: Skilled Nursing Facility Living arrangements for the past 2 months: Single Family Home                                       Social Determinants of Health (SDOH) Interventions SDOH Screenings   Tobacco Use: Medium Risk (09/02/2023)    Readmission Risk Interventions     No data to display

## 2024-06-01 ENCOUNTER — Other Ambulatory Visit (HOSPITAL_COMMUNITY): Payer: Self-pay

## 2024-06-01 DIAGNOSIS — J189 Pneumonia, unspecified organism: Secondary | ICD-10-CM | POA: Diagnosis not present

## 2024-06-01 DIAGNOSIS — J9601 Acute respiratory failure with hypoxia: Secondary | ICD-10-CM | POA: Diagnosis not present

## 2024-06-01 DIAGNOSIS — A419 Sepsis, unspecified organism: Secondary | ICD-10-CM

## 2024-06-01 LAB — BASIC METABOLIC PANEL WITH GFR
Anion gap: 6 (ref 5–15)
BUN: 10 mg/dL (ref 8–23)
CO2: 27 mmol/L (ref 22–32)
Calcium: 8.4 mg/dL — ABNORMAL LOW (ref 8.9–10.3)
Chloride: 107 mmol/L (ref 98–111)
Creatinine, Ser: 0.61 mg/dL (ref 0.61–1.24)
GFR, Estimated: >60 mL/min (ref >60)
Glucose, Bld: 85 mg/dL (ref 70–99)
Potassium: 4 mmol/L (ref 3.5–5.1)
Sodium: 140 mmol/L (ref 135–145)

## 2024-06-01 LAB — MAGNESIUM: Magnesium: 2.1 mg/dL (ref 1.7–2.4)

## 2024-06-01 MED ORDER — FUROSEMIDE 40 MG PO TABS: 40.0000 mg | ORAL_TABLET | Freq: Every day | ORAL | Status: DC | PRN

## 2024-06-01 MED ORDER — MIDODRINE HCL 5 MG PO TABS
5.0000 mg | ORAL_TABLET | Freq: Two times a day (BID) | ORAL | 0 refills | Status: AC
Start: 2024-06-01 — End: ?
  Filled 2024-06-01: qty 60, 30d supply, fill #0

## 2024-06-01 MED ORDER — POLYETHYLENE GLYCOL 3350 17 GM/SCOOP PO POWD
17.0000 g | Freq: Every day | ORAL | 0 refills | Status: AC | PRN
  Filled 2024-06-01: qty 238, 14d supply, fill #0

## 2024-06-01 MED ORDER — IPRATROPIUM-ALBUTEROL 0.5-2.5 (3) MG/3ML IN SOLN
RESPIRATORY_TRACT | 0 refills | Status: AC
  Filled 2024-06-01: qty 360, 15d supply, fill #0

## 2024-06-01 MED ORDER — AMOXICILLIN-POT CLAVULANATE 875-125 MG PO TABS
1.0000 | ORAL_TABLET | Freq: Two times a day (BID) | ORAL | 0 refills | Status: DC
  Filled 2024-06-01: qty 6, 3d supply, fill #0

## 2024-06-01 MED ORDER — FUROSEMIDE 40 MG PO TABS
40.0000 mg | ORAL_TABLET | Freq: Every day | ORAL | 0 refills | Status: DC | PRN
  Filled 2024-06-01: qty 30, 30d supply, fill #0

## 2024-06-01 MED ORDER — AMIODARONE HCL 200 MG PO TABS: 200.0000 mg | ORAL_TABLET | Freq: Every day | ORAL | Status: DC

## 2024-06-01 MED ORDER — METOPROLOL TARTRATE 12.5 MG HALF TABLET: 12.5000 mg | ORAL_TABLET | Freq: Two times a day (BID) | ORAL | Status: DC

## 2024-06-01 MED ORDER — AMIODARONE HCL 200 MG PO TABS
200.0000 mg | ORAL_TABLET | Freq: Two times a day (BID) | ORAL | Status: DC
  Administered 2024-06-01: 200 mg via ORAL
  Filled 2024-06-01: qty 1

## 2024-06-01 MED ORDER — AMIODARONE HCL 200 MG PO TABS
ORAL_TABLET | ORAL | 0 refills | Status: AC
  Filled 2024-06-01: qty 44, 37d supply, fill #0

## 2024-06-01 NOTE — Progress Notes (Signed)
 Progress Note  Patient Name: Anthony Fields Date of Encounter: 06/01/2024  Primary Cardiologist:   None Mallipeddi   Subjective   Amiodarone  IV started 7/6. Tolerating well. Family at bedside.   Inpatient Medications    Scheduled Meds:  amoxicillin -clavulanate  1 tablet Oral Q12H   apixaban   5 mg Oral BID   arformoterol   15 mcg Nebulization BID   atorvastatin   20 mg Oral Daily   Chlorhexidine  Gluconate Cloth  6 each Topical Daily   diltiazem   60 mg Oral Q8H   fenofibrate   54 mg Oral Daily   midodrine   5 mg Oral TID WC   oxybutynin   5 mg Oral BID   pantoprazole   40 mg Oral Q supper   polyethylene glycol  17 g Oral Daily   revefenacin   175 mcg Nebulization Daily   tamsulosin   0.4 mg Oral QPC supper   Continuous Infusions:  amiodarone  30 mg/hr (06/01/24 0607)   PRN Meds: diltiazem , docusate, haloperidol  lactate, melatonin, mouth rinse   Vital Signs    Vitals:   05/31/24 2355 06/01/24 0000 06/01/24 0400 06/01/24 0622  BP: 101/68 (!) 110/59 103/65 (!) 108/56  Pulse: 100 95 89   Resp: (!) 23 20 (!) 24   Temp: 98.9 F (37.2 C)  97.8 F (36.6 C)   TempSrc: Axillary  Axillary   SpO2: 92% 93% (!) 83%   Weight:   79.6 kg   Height:        Intake/Output Summary (Last 24 hours) at 06/01/2024 0820 Last data filed at 06/01/2024 0607 Gross per 24 hour  Intake --  Output 1150 ml  Net -1150 ml    Filed Weights   05/30/24 0403 05/31/24 0500 06/01/24 0400  Weight: 74 kg 74.4 kg 79.6 kg    Telemetry    Atrial fib with rate about 90.   - Personally Reviewed  ECG    NA - Personally Reviewed  Physical Exam   GEN:   Chronically ill appearing Neck: No  JVD Cardiac: Irregular RR, no murmurs, rubs, or gallops.  Respiratory:     Decreased breath sounds  GI: Soft, nontender, non-distended  MS: No  edema; No deformity. Neuro:  overall nonfocal Psych:   oriented and alert.  Labs    Chemistry Recent Labs  Lab 05/30/24 0433 05/31/24 0519 06/01/24 0602  NA 143 141  140  K 3.9 3.7 4.0  CL 105 106 107  CO2 30 27 27   GLUCOSE 103* 95 85  BUN 12 11 10   CREATININE 0.84 0.64 0.61  CALCIUM  8.8* 8.3* 8.4*  GFRNONAA >60 >60 >60  ANIONGAP 8 8 6      Hematology Recent Labs  Lab 05/29/24 0404 05/30/24 0433 05/31/24 0519  WBC 10.0 9.4 9.4  RBC 3.59* 3.42* 3.09*  HGB 11.0* 10.6* 9.3*  HCT 32.6* 30.7* 28.4*  MCV 90.8 89.8 91.9  MCH 30.6 31.0 30.1  MCHC 33.7 34.5 32.7  RDW 18.0* 18.3* 18.8*  PLT 216 218 180    Cardiac EnzymesNo results for input(s): TROPONINI in the last 168 hours. No results for input(s): TROPIPOC in the last 168 hours.   BNP Recent Labs  Lab 05/29/24 0404 05/30/24 0433 05/31/24 0519  BNP 892.2* 817.6* 381.0*     DDimer No results for input(s): DDIMER in the last 168 hours.   Radiology    DG Chest Port 1 View Result Date: 05/31/2024 CLINICAL DATA:  141880 SOB (shortness of breath) [id number] EXAM: PORTABLE CHEST 1 VIEW COMPARISON:  Multiple  recent radiographs ranging from 05/24/2024 through 05/30/2024. Older examination 01/28/2020. Chest CT 05/26/2024. FINDINGS: 0922 hours. The heart size and mediastinal contours are stable with mild cardiomegaly and aortic atherosclerosis. No significant change in the patchy bilateral airspace opacities and small bilateral pleural effusions. Right lung involvement appears greatest in the upper lobe. No evidence of pneumothorax. The bones appear unchanged. IMPRESSION: No significant change in patchy bilateral airspace opacities and small bilateral pleural effusions. Electronically Signed   By: Elsie Perone M.D.   On: 05/31/2024 09:45   DG Chest Port 1 View Result Date: 05/30/2024 CLINICAL DATA:  Shortness of breath EXAM: PORTABLE CHEST 1 VIEW COMPARISON:  05/28/2024 FINDINGS: Stable cardiac enlargement. Stable lung volumes. Aortic atherosclerotic calcifications. Pleural effusions appear decreased from previous exam. No significant change in the appearance of bilateral interstitial and airspace  opacities. Visualized osseous structures appear grossly intact. IMPRESSION: 1. Decreased pleural effusions. 2. No significant change in the appearance of bilateral interstitial and airspace opacities. Electronically Signed   By: Waddell Calk M.D.   On: 05/30/2024 09:21    Cardiac Studies     Echo  1. Left ventricular ejection fraction, by estimation, is 45 to 50%. Left  ventricular ejection fraction by 2D MOD biplane is 38.7 %. The left  ventricle has mildly decreased function. The left ventricle demonstrates  global hypokinesis. There is mild  asymmetric left ventricular hypertrophy of the septal segment. Left  ventricular diastolic parameters are indeterminate.   2. Right ventricular systolic function is mildly reduced. The right  ventricular size is normal. There is mildly elevated pulmonary artery  systolic pressure. The estimated right ventricular systolic pressure is  43.5 mmHg.   3. Left atrial size was severely dilated.   4. Right atrial size was severely dilated.   5. The mitral valve is abnormal. Mild mitral valve regurgitation.   6. The tricuspid valve is abnormal. Tricuspid valve regurgitation is  moderate to severe.   7. The aortic valve is tricuspid. Aortic valve regurgitation is mild.  Aortic valve sclerosis is present, with no evidence of aortic valve  stenosis.   8. There is mild dilatation of the ascending aorta, measuring 40 mm.   9. The inferior vena cava is dilated in size with <50% respiratory  variability, suggesting right atrial pressure of 15 mmHg.   Patient Profile     89 y.o. male  known to have persistent A-fib (diagnosed 1 year ago in [place]), HTN, HLD was transferred from [facility] for septic shock secondary to recurrent urosepsis, mechanically ventilated and on pressors and atrial fibrillation with RVR.   Assessment & Plan    Atrial fibrillation with RVR: Initially presented in septic shock and was in A-fib with RVR.  Improved with treatment of sepsis  but remains in RVR -Started metoprolol  25 mg twice daily, but soft BP has limited metoprolol  use. Midodrine  was added.  Digoxin  was added by primary team, but would favor avoiding digoxin  given toxicity risk in elderly patient.   - amio gtt continues started 7/6. Tx to oral today. Amiodarone  200 mg BID x 7 days then 200 mg daily.  - will add metoprolol  12.5 mg BID -Continue Eliquis  5 mg twice daily - pt and family plan on going home today, will anticipate afib clinic visit in 2-3 weeks and if still in afib, plan for outpatient DCCV if patient would like to follow in [place]. Family states he has been in afib for 7-8 mo, may be reasonable to consider attempt at SR, though atria  are severely dilated and likelihood of maintaining SR is low.   Acute systolic heart failure:   Echo this admission shows EF 45%, mild RV dysfunction.  Suspected cardiomyopathy in setting of sepsis.  Would monitor for improvement as he recovers from acute illness.  He has been being diuresed with IV Lasix .  Appears euvolemic on exam, but weight going up, query if spurious. Would give lasix  40 mg daily PRN swelling or shortness of breath.   Acute respiratory failure with hypoxia: Possible pneumonia versus systolic heart failure.  He initially required intubation in ICU.  He was extubated on 7/3. Being treated for pneumonia. Aspiration events are recurrent. Consider goals of care.   Primary team would like to discharge home today. Reasonable to consider dc from CV standpoint. F/u arranged.  For questions or updates, please contact [facility] Please consult [email or web address] for contact info under Cardiology/STEMI.   Signed, Soyla DELENA Merck, MD  06/01/2024, 8:20 AM

## 2024-06-01 NOTE — Progress Notes (Signed)
    Durable Medical Equipment  (From admission, onward)           Start     Ordered   06/01/24 0000  For home use only DME Nebulizer/meds       Question Answer Comment  Patient needs a nebulizer to treat with the following condition COPD (chronic obstructive pulmonary disease) (HCC)   Length of Need 6 Months      06/01/24 0855   06/01/24 0000  For home use only DME oxygen       Question Answer Comment  Length of Need 6 Months   Mode or (Route) Nasal cannula   Liters per Minute 2   Frequency Continuous (stationary and portable oxygen unit needed)   Oxygen conserving device Yes   Oxygen delivery system Gas      06/01/24 0855            Murray Yetta RAMAN, RN  Murray Yetta RAMAN, RN  Registered Nurse   Final Progress Note     Signed   Date of Service: 06/01/2024 11:09 AM   Signed      SATURATION QUALIFICATIONS: (This note is used to comply with regulatory documentation for home oxygen)   Patient Saturations on Room Air at Rest = 90%   Patient Saturations on Room Air while Ambulating = 86%   Patient Saturations on 3 Liters of oxygen while Ambulating = 94%   Please briefly explain why patient needs home oxygen: Patient does not maintain sufficient oxygen saturation without the use of 3L nasal cannula. It also takes pt about 1 minute to recover when oxygen hasn't been applied and is reapplied after ambulation

## 2024-06-01 NOTE — Plan of Care (Signed)

## 2024-06-01 NOTE — TOC Transition Note (Signed)
 Transition of Care [facility]) - Discharge Note   Patient Details  Name: Anthony Fields MRN: [id number] Date of Birth: 08-29-1932  Transition of Care [facility]) CM/SW Contact:  Rosaline JONELLE Joe, RN Phone Number: 06/01/2024, 11:47 AM   Clinical Narrative:    CM met with the patient and family at the bedside and family was offered Medicare choice regarding DME company and they did not have a preference.  I provided DME note with walking test attached to have MD co-sign.  [employer] was called and they plan to deliver portable oxygen tank to the bedside prior to discharge to home today.  DME at the home includes RW, Wc, bedside commode and hospital bed.  Patient is active with [facility] - HH orders are in place.  I called [facility] and they are aware that patient is discharging home today once home oxygen arrives to the bedside.  Patient's family plans to take him home by car after oxygen arrives.     Barriers to Discharge: Continued Medical Work up, SNF Pending bed offer   Patient Goals and CMS Choice     Choice offered to / list presented to : Adult Children (daughter Arland requests [facility] in [place])      Discharge Placement                       Discharge Plan and Services Additional resources added to the After Visit Summary for   In-house Referral: Clinical Social Work   Post Acute Care Choice: Skilled Nursing Facility                               Social Drivers of Health (SDOH) Interventions SDOH Screenings   Tobacco Use: Medium Risk (09/02/2023)     Readmission Risk Interventions     No data to display

## 2024-06-01 NOTE — Progress Notes (Signed)
 Reviewed AVS, patient expressed understanding of medications, MD follow up reviewed.   Removed IV, Site clean, dry and intact.  Patient states all belongings brought to the hospital at time of admission are accounted for and packed to take home.   Patient and patients family informed and expressed understanding to pick up medications from [facility] pharmacy before leaving hospital.  Patient waiting on O2 delivery.

## 2024-06-01 NOTE — Progress Notes (Signed)
 Occupational Therapy Treatment Patient Details Name: Anthony Fields MRN: [id number] DOB: 22-Jun-1932 Today's Date: 06/01/2024   History of present illness 89 yo male admitted 6/30 with urosepsis and pnA with respiratory decline in ED and intubated. 7/3 extubated. PMHx; hip fx, indwelling foley   OT comments  Pt making gradual progress towards OT goals with pt/family opting for DC home today and interested in [facility] therapies. Guided pt in bed mobility and mobility to/from sink using RW with Mod A. Pt requiring extensive assist for LB ADLs and hands on assist for all standing tasks to decrease fall risk. Family at bedside during session. Discussed use of gait belt, chair follow with gait attempts and DME needs for ADL considerations. Family deny concerns, plan to obtain shower chair at DC.       If plan is discharge home, recommend the following:  Two people to help with walking and/or transfers;Assistance with cooking/housework;Direct supervision/assist for medications management;Direct supervision/assist for financial management;Assist for transportation;Help with stairs or ramp for entrance;A lot of help with bathing/dressing/bathroom   Equipment Recommendations  None recommended by OT (family to purchase shower chair at outside venue)    Recommendations for Other Services      Precautions / Restrictions Precautions Precautions: Fall;Other (comment) Recall of Precautions/Restrictions: Impaired Precaution/Restrictions Comments: watch sats and HR, left hip internal rotation Restrictions Weight Bearing Restrictions Per Provider Order: No       Mobility Bed Mobility Overal bed mobility: Needs Assistance Bed Mobility: Supine to Sit     Supine to sit: Min assist, HOB elevated, Used rails     General bed mobility comments: assist for LLE to EOB. pt with use of bedrail to lift trunk well    Transfers Overall transfer level: Needs assistance Equipment used: Rolling walker (2  wheels) Transfers: Sit to/from Stand Sit to Stand: Mod assist           General transfer comment: Mod A to stand from bedside, from recliner as well. cues for hand placement     Balance Overall balance assessment: Needs assistance, History of Falls Sitting-balance support: No upper extremity supported, Feet supported Sitting balance-Leahy Scale: Fair     Standing balance support: Bilateral upper extremity supported, During functional activity, Reliant on assistive device for balance Standing balance-Leahy Scale: Poor                             ADL either performed or assessed with clinical judgement   ADL Overall ADL's : Needs assistance/impaired     Grooming: Moderate assistance;Standing;Wash/dry face Grooming Details (indicate cue type and reason): Mod A for balance standing at sink to wet washcloth and wring out excess water. 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 RUE Coordination: decreased fine motor LUE Deficits / Details: rotator cuff tear with limited shoulder motion grossly 70 degrees flexion LUE Coordination: decreased gross motor   Lower Extremity Assessment Lower Extremity Assessment: Defer to PT evaluation  Vision   Vision Assessment?: No apparent visual deficits   Perception     Praxis     Communication Communication Communication: Impaired Factors Affecting Communication: Hearing impaired   Cognition Arousal: Alert Behavior During Therapy: Flat affect, WFL for  tasks assessed/performed Cognition: Cognition impaired     Awareness: Intellectual awareness intact, Online awareness impaired Memory impairment (select all impairments): Short-term memory, Working memory, Engineer, structural memory Attention impairment (select first level of impairment): Sustained attention, Selective attention Executive functioning impairment (select all impairments): Initiation, Problem solving, Reasoning OT - Cognition Comments: pt with some decreased insight into deficits, safety awareness. consistent one step command following, pleasant though easily distracted                 Following commands: Impaired Following commands impaired: Follows one step commands with increased time      Cueing   Cueing Techniques: Verbal cues, Gestural cues, Tactile cues  Exercises      Shoulder Instructions       General Comments Daughter present during session. Variable O2 reading on 2 L O2, no apparent SOB. HR to 132    Pertinent Vitals/ Pain       Pain Assessment Pain Assessment: No/denies pain  Home Living                                          Prior Functioning/Environment              Frequency  Min 2X/week        Progress Toward Goals  OT Goals(current goals can now be found in the care plan section)  Progress towards OT goals: Progressing toward goals  Acute Rehab OT Goals OT Goal Formulation: With patient Time For Goal Achievement: 06/11/24 Potential to Achieve Goals: Good ADL Goals Pt Will Perform Grooming: with min assist;standing Pt Will Transfer to Toilet: with min assist;ambulating;bedside commode Pt Will Perform Toileting - Clothing Manipulation and hygiene: with mod assist;sit to/from stand Additional ADL Goal #1: Pt will complete bed mobility with min assist in preparation for ADLs.  Plan      Co-evaluation                 AM-PAC OT 6 Clicks Daily Activity     Outcome Measure   Help from  another person eating meals?: A Little Help from another person taking care of personal grooming?: A Little Help from another person toileting, which includes using toliet, bedpan, or urinal?: Total Help from another person bathing (including washing, rinsing, drying)?: A Lot Help from another person to put on and taking off regular upper body clothing?: A Little Help from another person to put on and taking off regular lower body clothing?: Total 6 Click Score: 13    End of Session Equipment Utilized During Treatment: Rolling walker (2 wheels);Gait belt  OT Visit Diagnosis: Unsteadiness on feet (R26.81);Muscle weakness (generalized) (M62.81);Other symptoms and signs involving cognitive function;Pain   Activity Tolerance Patient tolerated treatment well   Patient Left in chair;with call bell/phone within reach;with family/visitor present   Nurse Communication          Time: [phone] OT Time Calculation (min): 32 min  Charges: OT General Charges $OT Visit: 1 Visit OT Treatments $Self Care/Home Management : 23-37 mins  Mliss NOVAK, OTR/L Acute Rehab Services Office: [phone]   Mliss Fish 06/01/2024, 9:41 AM

## 2024-06-01 NOTE — Discharge Summary (Signed)
 Anthony Fields FMW:[id number] DOB: [birth year] DOA: 05/24/2024  PCP: Maree Virgilio SAUNDERS, MD  Admit date: 05/24/2024  Discharge date: 06/01/2024  Admitted From: Home   disposition: Home.  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   Home Health: PT, SLP, RN if qualifies Equipment/Devices: 2 L nasal cannula home oxygen Consultations: Cardiology, PCCM Discharge Condition: Stable    CODE STATUS: Full    Diet Recommendation: Heart Healthy     Chief Complaint  Patient presents with   Code Sepsis     Brief history of present illness from the day of admission and additional interim summary     89 y.o. male who has a PMH of recent left hip fracture in March requiring surgical correction with limited improvement in left leg function since the surgery, neurogenic bladder requiring Foley catheter placement in March during hip surgery, recurrent UTIs related to Foley catheter since then, atrial fibrillation with [place] vas 2 score of greater than 3, dyslipidemia, hypertension.  Patient with above history who was admitted initially in March for hip fracture, subsequently went home with Foley catheter and developed urosepsis in April, he was stabilized and back home, presented to [facility] ER again this time for urosepsis.   He developed CHF/pneumonia causing acute hypoxic respiratory failure requiring intubation, he was subsequently sent to [facility] under the ICU  team care, he was initially requiring pressors, stabilized and transferred to my care on 05/28/2024 after his extubation on 05/27/2024.                                                                 Hospital Course   Recurrent urosepsis in the setting of Foley catheter present on admission.  Foley catheter was changed this admission, responded well to Rocephin  finished 7-day course.  Continue Foley care at home as 4.   Acute hypoxic respiratory failure - unclear etiology, possible pneumonia and acute on chronic systolic CHF echocardiogram noted with EF around 40%, global hypokinesis, required antibiotics and diuretics in ICU along with intubation, responded well to treatment, extubated on 05/27/2024 now stable on 2 L nasal cannula oxygen at rest, on room air oxygen drops to 87, upon application of 2 L nasal cannula oxygen pulse ox rises to above 92%.  He has now been placed on as needed Lasix , currently euvolemic, symptom-free, eager to go home.  Family wants to take him home immediately refused SNF.   Sepsis with  possible shock physiology - due to above initially required pressors now off of it, sepsis pathophysiology completely resolved.   Hx PAF patient on amiodarone  and beta-blocker drip along with Eliquis , rate stable, echo nonacute, will be discharged with outpatient cardiology follow-up.  Case discussed with cardiologist Dr. Loni today.   Dyslipidemia.  On statin along with Tricor .   Essential hypertension.  Blood pressure low normal, low-dose midodrine  upon discharge.   Neurogenic bladder with indwelling Foley catheter present on admission.  Supportive care catheter was changed upon this admission, on Ditropan  and Flomax  which will be continued.  Follow-up with primary urologist and PCP postdischarge.   Recent left hip fracture.  Surgical correction in March.  Still quite weak, PT OT.   Incidental finding of 4.1 cm descending aortic aneurysm.  Beta-blocker with outpatient follow-up with PCP  and cardiothoracic surgery   Hypokalemia, hypophosphatemia and hypomagnesemia.  Replaced.   Acute metabolic encephalopathy.  Hospital-acquired delirium.  Supportive care completely resolved.   Choking episode eating a large bite of breakfast.  Counseled to take small bites, repeat chest x-ray, will, on Augmentin  finish 4 days of treatment, no current signs of active pneumonia.  Speech therapy at home if he qualifies   Discharge diagnosis     Principal Problem:   Pneumonia Active Problems:   Acute respiratory failure with hypoxia (HCC)   Pressure injury of skin   Atrial fibrillation with rapid ventricular response (HCC)   Acute systolic heart failure [facility])    Discharge instructions    Discharge Instructions     Discharge instructions   Complete by: As directed    Follow with Primary MD Maree Virgilio SAUNDERS, MD and with your cardiologist in 7 days   Get CBC, CMP, 2 view Chest X ray -  checked next visit with your primary MD   Activity: As tolerated with Full fall precautions use walker/cane & assistance as needed  Disposition Home   Diet: Soft consistency diet, all sitting up in the chair with feeding assistance and aspiration precautions.  Total 1.5 L fluid restriction per day.    Special Instructions: If you have smoked or chewed Tobacco  in the last 2 yrs please stop smoking, stop any regular Alcohol  and or any Recreational drug use.  On your next visit with your primary care physician please Get Medicines reviewed and adjusted.  Please request your Prim.MD to go over all Hospital Tests and Procedure/Radiological results at the follow up, please get all Hospital records sent to your Prim MD by signing hospital release before you go home.  If you experience worsening of your admission symptoms, develop shortness of breath, life threatening emergency, suicidal or homicidal thoughts you must seek medical attention immediately by calling 911 or calling your MD immediately  if  symptoms less severe.  You Must read complete instructions/literature along with all the possible adverse reactions/side effects for all the Medicines you take and that have been prescribed to you. Take any new Medicines after you have completely understood and accpet all the possible adverse reactions/side effects.   Do not drive when taking Pain medications.  Do not take more than prescribed Pain, Sleep and Anxiety Medications  Wear Seat belts while driving.   For home use only DME Nebulizer/meds   Complete by: As directed    Patient needs a nebulizer to treat with the following condition: COPD (chronic obstructive pulmonary disease) (HCC)   Length of Need: 6 Months   For home use only DME oxygen  Complete by: As directed    Length of Need: 6 Months   Mode or (Route): Nasal cannula   Liters per Minute: 2   Frequency: Continuous (stationary and portable oxygen unit needed)   Oxygen conserving device: Yes   Oxygen delivery system: Gas   Increase activity slowly   Complete by: As directed    No wound care   Complete by: As directed        Discharge Medications   Allergies as of 06/01/2024   No Known Allergies      Medication List     STOP taking these medications    cefUROXime 500 MG tablet Commonly known as: CEFTIN   diltiazem  60 MG tablet Commonly known as: CARDIZEM    sulfamethoxazole-trimethoprim 800-160 MG tablet Commonly known as: BACTRIM DS       TAKE these medications    amiodarone  200 MG tablet Commonly known as: PACERONE  Take 1 tablet (200 mg total) by mouth 2 (two) times daily for 7 days, THEN 1 tablet (200 mg total) daily. Start taking on: June 01, 2024   amoxicillin -clavulanate 875-125 MG tablet Commonly known as: AUGMENTIN  Take 1 tablet by mouth every 12 (twelve) hours.   atorvastatin  40 MG tablet Commonly known as: LIPITOR Take 40 mg by mouth daily.   cholecalciferol 25 MCG (1000 UNIT) tablet Commonly known as: VITAMIN D3 Take 1,000 Units  by mouth daily.   Eliquis  5 MG Tabs tablet Generic drug: apixaban  Take 5 mg by mouth 2 (two) times daily.   fenofibrate  145 MG tablet Commonly known as: TRICOR  Take 145 mg by mouth daily.   furosemide  40 MG tablet Commonly known as: LASIX  Take 1 tablet (40 mg total) by mouth daily as needed for fluid or edema (swelling, sob).   ipratropium-albuterol  0.5-2.5 (3) MG/3ML Soln Commonly known as: DUONEB Use twice a day scheduled and every 4 hours as needed for shortness of breath and wheezing   metoprolol  succinate 25 MG 24 hr tablet Commonly known as: TOPROL -XL Take 25 mg by mouth daily.   midodrine  5 MG tablet Commonly known as: PROAMATINE  Take 1 tablet (5 mg total) by mouth 2 (two) times daily with a meal.   niacin 1000 MG CR tablet Commonly known as: NIASPAN Take 1,000 mg by mouth at bedtime.   oxybutynin  5 MG tablet Commonly known as: DITROPAN  Take 5 mg by mouth 2 (two) times daily.   pantoprazole  40 MG tablet Commonly known as: PROTONIX  Take 40 mg by mouth daily.   polyethylene glycol 17 g packet Commonly known as: MIRALAX  / GLYCOLAX  Take 17 g by mouth daily as needed.   tamsulosin  0.4 MG Caps capsule Commonly known as: FLOMAX  Take 0.4 mg by mouth daily after supper.               Durable Medical Equipment  (From admission, onward)           Start     Ordered   06/01/24 0000  For home use only DME Nebulizer/meds       Question Answer Comment  Patient needs a nebulizer to treat with the following condition COPD (chronic obstructive pulmonary disease) (HCC)   Length of Need 6 Months      06/01/24 0855   06/01/24 0000  For home use only DME oxygen       Question Answer Comment  Length of Need 6 Months   Mode or (Route) Nasal cannula   Liters per Minute 2   Frequency Continuous (stationary and  portable oxygen unit needed)   Oxygen conserving device Yes   Oxygen delivery system Gas      06/01/24 0855             Contact information for  follow-up providers     Maree Virgilio SAUNDERS, MD. Schedule an appointment as soon as possible for a visit in 1 week(s).   Specialty: Family Medicine Why: And your cardiologist within a week of discharge Contact information: [address] [place] [address] [phone]         Nellene Quita SAUNDERS, [place] Follow up on 06/21/2024.   Specialty: Cardiology Why: at 830am for your A fib appointment Contact information: [address] [place] [address] [phone]              Contact information for after-discharge care     Destination     [facility] .   Service: Skilled Nursing Contact information: [address] [place]  [address] [phone]                     Major procedures and Radiology Reports - PLEASE review detailed and final reports thoroughly  -      DG Chest Port 1 View Result Date: 05/31/2024 CLINICAL DATA:  [id number] SOB (shortness of breath) [id number] EXAM: PORTABLE CHEST 1 VIEW COMPARISON:  Multiple recent radiographs ranging from 05/24/2024 through 05/30/2024. Older examination 01/28/2020. Chest CT 05/26/2024. FINDINGS: 0922 hours. The heart size and mediastinal contours are stable with mild cardiomegaly and aortic atherosclerosis. No significant change in the patchy bilateral airspace opacities and small bilateral pleural effusions. Right lung involvement appears greatest in the upper lobe. No evidence of pneumothorax. The bones appear unchanged. IMPRESSION: No significant change in patchy bilateral airspace opacities and small bilateral pleural effusions. Electronically Signed   By: Elsie Perone M.D.   On: 05/31/2024 09:45   DG Chest Port 1 View Result Date: 05/30/2024 CLINICAL DATA:  Shortness of breath EXAM: PORTABLE CHEST 1 VIEW COMPARISON:  05/28/2024 FINDINGS: Stable cardiac enlargement. Stable lung volumes. Aortic atherosclerotic calcifications. Pleural effusions appear decreased from previous  exam. No significant change in the appearance of bilateral interstitial and airspace opacities. Visualized osseous structures appear grossly intact. IMPRESSION: 1. Decreased pleural effusions. 2. No significant change in the appearance of bilateral interstitial and airspace opacities. Electronically Signed   By: Waddell Calk M.D.   On: 05/30/2024 09:21   DG Chest Port 1 View Result Date: 05/28/2024 CLINICAL DATA:  89 year old male with shortness of breath, sepsis. EXAM: PORTABLE CHEST 1 VIEW COMPARISON:  05/25/2024 and earlier. FINDINGS: Portable AP semi upright view at 0601 hours. Extubated. Enteric tube removed. Stable lung volumes. Stable cardiac size and mediastinal contours. Calcified aortic atherosclerosis. Visualized tracheal air column is within normal limits. Coarse left greater than right perihilar and upper lung predominant pulmonary opacity has not significantly changed from 05/25/2024. Bilateral pleural effusions better demonstrated on 05/26/2024 CTA. No areas of worsening ventilation. Stable visualized osseous structures. Negative visible bowel gas. IMPRESSION: 1. Extubated. Enteric tube removed. 2. Stable lung volumes and ventilation since 05/25/2024, CTA on 05/26/2024 (please see that report). 3. No new cardiopulmonary abnormality. Electronically Signed   By: VEAR Hurst M.D.   On: 05/28/2024 07:07   CT Angio Chest Pulmonary Embolism (PE) W or WO Contrast Result Date: 05/26/2024 CLINICAL DATA:  Pulmonary embolism suspected, high probability. EXAM: CT ANGIOGRAPHY CHEST WITH CONTRAST TECHNIQUE: Multidetector CT imaging of the chest was performed using the  standard protocol during bolus administration of intravenous contrast. Multiplanar CT image reconstructions and MIPs were obtained to evaluate the vascular anatomy. RADIATION DOSE REDUCTION: This exam was performed according to the departmental dose-optimization program which includes automated exposure control, adjustment of the mA and/or kV  according to patient size and/or use of iterative reconstruction technique. CONTRAST:  75mL OMNIPAQUE  IOHEXOL  350 MG/ML SOLN COMPARISON:  None Available. FINDINGS: Cardiovascular: Negative for pulmonary embolus. Atherosclerotic calcification of the aorta, aortic valve and coronary arteries. Ascending aorta measures 4.1 cm (coronal image 90). Enlarged pulmonic trunk and heart. No pericardial effusion. Mediastinum/Nodes: No pathologically enlarged mediastinal, hilar or axillary lymph nodes. Nasogastric tube in the esophagus. Lungs/Pleura: Diffuse patchy bilateral ground-glass and scattered consolidation. Emphysema (ICD10-J43.9). Moderate bilateral pleural effusions. Debris in the airway. Endotracheal tube terminates 2.9 cm above the carina. Upper Abdomen: Patient's arms create streak artifact in the upper abdomen, degrading image quality. Trace ascites. Nasogastric tube terminates in the gastric antrum. Visualized portions of the liver, gallbladder, adrenal glands, kidneys, spleen, pancreas, stomach and bowel are otherwise grossly unremarkable. No upper abdominal adenopathy. Musculoskeletal: Degenerative changes in the spine. Review of the MIP images confirms the above findings. IMPRESSION: 1. Negative for pulmonary embolus. 2. Congestive heart failure. The possibility superimposed bilateral pneumonia is entertained. 3. Trace ascites. 4. 4.1 cm ascending aortic aneurysm. Follow-up is at the discretion of the referring provider, given patient age. Typically,recommend annual imaging followup by CTA or MRA. This recommendation follows 2010 ACCF/AHA/AATS/ACR/ASA/SCA/SCAI/SIR/STS/SVM Guidelines for the Diagnosis and Management of Patients with Thoracic Aortic Disease. Circulation. 2010; 121: Z733-z630. Aortic aneurysm NOS (ICD10-I71.9). 5. Aortic atherosclerosis (ICD10-I70.0). Coronary artery calcification. 6. Enlarged pulmonic trunk, indicative of pulmonary arterial hypertension. 7.  Emphysema (ICD10-J43.9).  Electronically Signed   By: Newell Eke M.D.   On: 05/26/2024 11:47   ECHOCARDIOGRAM COMPLETE Result Date: 05/25/2024    ECHOCARDIOGRAM REPORT   Patient Name:   Anthony Fields  Date of Exam: 05/25/2024 Medical Rec #:  [id number]  Height:       68.0 in Accession #:    [id number] Weight:       164.5 lb Date of Birth:  1932-04-04   BSA:          1.881 m Patient Age:    89 years   BP:           99/65 mmHg Patient Gender: M          HR:           130 bpm. Exam Location:  Inpatient Procedure: 2D Echo, Color Doppler and Cardiac Doppler (Both Spectral and Color            Flow Doppler were utilized during procedure). Indications:    Shock R57.9, Sepsis  History:        Patient has no prior history of Echocardiogram examinations.                 Arrythmias:Atrial Fibrillation; Risk Factors:Hypertension and                 Dyslipidemia.  Sonographer:    Damien Senior RDCS Referring Phys: [id number] RAHUL P DESAI IMPRESSIONS  1. Left ventricular ejection fraction, by estimation, is 45 to 50%. Left ventricular ejection fraction by 2D MOD biplane is 38.7 %. The left ventricle has mildly decreased function. The left ventricle demonstrates global hypokinesis. There is mild asymmetric left ventricular hypertrophy of the septal segment. Left ventricular diastolic parameters are indeterminate.  2. Right ventricular systolic function is mildly reduced.  The right ventricular size is normal. There is mildly elevated pulmonary artery systolic pressure. The estimated right ventricular systolic pressure is 43.5 mmHg.  3. Left atrial size was severely dilated.  4. Right atrial size was severely dilated.  5. The mitral valve is abnormal. Mild mitral valve regurgitation.  6. The tricuspid valve is abnormal. Tricuspid valve regurgitation is moderate to severe.  7. The aortic valve is tricuspid. Aortic valve regurgitation is mild. Aortic valve sclerosis is present, with no evidence of aortic valve stenosis.  8. There is mild dilatation of the  ascending aorta, measuring 40 mm.  9. The inferior vena cava is dilated in size with <50% respiratory variability, suggesting right atrial pressure of 15 mmHg. FINDINGS  Left Ventricle: Left ventricular ejection fraction, by estimation, is 45 to 50%. Left ventricular ejection fraction by 2D MOD biplane is 38.7 %. The left ventricle has mildly decreased function. The left ventricle demonstrates global hypokinesis. The left ventricular internal cavity size was normal in size. There is mild asymmetric left ventricular hypertrophy of the septal segment. Left ventricular diastolic function could not be evaluated due to atrial fibrillation. Left ventricular diastolic parameters are indeterminate. Right Ventricle: The right ventricular size is normal. No increase in right ventricular wall thickness. Right ventricular systolic function is mildly reduced. There is mildly elevated pulmonary artery systolic pressure. The tricuspid regurgitant velocity  is 2.67 m/s, and with an assumed right atrial pressure of 15 mmHg, the estimated right ventricular systolic pressure is 43.5 mmHg. Left Atrium: Left atrial size was severely dilated. Right Atrium: Right atrial size was severely dilated. Pericardium: There is no evidence of pericardial effusion. Presence of epicardial fat layer. Mitral Valve: The mitral valve is abnormal. Mild mitral annular calcification. Mild mitral valve regurgitation. Tricuspid Valve: The tricuspid valve is abnormal. Tricuspid valve regurgitation is moderate to severe. The flow in the hepatic veins is reversed during ventricular systole. Aortic Valve: The aortic valve is tricuspid. Aortic valve regurgitation is mild. Aortic valve sclerosis is present, with no evidence of aortic valve stenosis. Pulmonic Valve: The pulmonic valve was normal in structure. Pulmonic valve regurgitation is mild. Aorta: The aortic root is normal in size and structure. Ascending aorta measurements are within normal limits for age  when indexed to body surface area. There is mild dilatation of the ascending aorta, measuring 40 mm. Venous: The inferior vena cava is dilated in size with less than 50% respiratory variability, suggesting right atrial pressure of 15 mmHg. IAS/Shunts: No atrial level shunt detected by color flow Doppler.  LEFT VENTRICLE PLAX 2D                        Biplane EF (MOD) LVIDd:         4.20 cm         LV Biplane EF:   Left LVIDs:         3.45 cm                          ventricular LV PW:         0.90 cm                          ejection LV IVS:        1.10 cm  fraction by LVOT diam:     1.90 cm                          2D MOD LV SV:         29                               biplane is LV SV Index:   15                               38.7 %. LVOT Area:     2.84 cm  LV Volumes (MOD) LV vol d, MOD    85.2 ml A2C: LV vol d, MOD    40.7 ml A4C: LV vol s, MOD    51.5 ml A2C: LV vol s, MOD    28.4 ml A4C: LV SV MOD A2C:   33.7 ml LV SV MOD A4C:   40.7 ml LV SV MOD BP:    24.2 ml RIGHT VENTRICLE RV S prime:     11.20 cm/s TAPSE (M-mode): 1.1 cm LEFT ATRIUM              Index        RIGHT ATRIUM           Index LA diam:        3.90 cm  2.07 cm/m   RA Area:     20.60 cm LA Vol (A2C):   104.0 ml 55.30 ml/m  RA Volume:   57.30 ml  30.47 ml/m LA Vol (A4C):   71.9 ml  38.23 ml/m LA Biplane Vol: 90.9 ml  48.33 ml/m  AORTIC VALVE LVOT Vmax:   68.30 cm/s LVOT Vmean:  46.900 cm/s LVOT VTI:    0.102 m  AORTA Ao Root diam: 3.50 cm Ao Asc diam:  4.00 cm TRICUSPID VALVE TR Peak grad:   28.5 mmHg TR Vmax:        267.00 cm/s  SHUNTS Systemic VTI:  0.10 m Systemic Diam: 1.90 cm Jerel Croitoru MD Electronically signed by Jerel Balding MD Signature Date/Time: 05/25/2024/2:29:32 PM    Final    DG Chest Portable 1 View Result Date: 05/25/2024 CLINICAL DATA:  Verify OG tube placement. EXAM: PORTABLE CHEST 1 VIEW COMPARISON:  May 24, 2024 FINDINGS: Since the prior study there has been interval placement of an  endotracheal tube with its distal tip approximately 2.2 cm from the carina. An orogastric tube is in place with its distal tip overlying the body of the stomach. The distal side hole sits at the expected level of the gastroesophageal junction. The cardiac silhouette is mildly enlarged and unchanged in size. Stable increased interstitial lung markings are noted, most prominent throughout the left lung, right upper lobe and right lung base. Very small bilateral pleural effusions are seen. No pneumothorax is identified. Multilevel degenerative changes are present throughout the thoracic spine. IMPRESSION: 1. Interval placement of an endotracheal tube and orogastric tube, as described above. Advancement of the orogastric tube by approximately 5 cm is recommended to decrease the risk of aspiration. 2. Stable increased interstitial lung markings, most prominent throughout the left lung, right upper lobe and right lung base. 3. Very small bilateral pleural effusions. Electronically Signed   By: Suzen Dials M.D.   On: 05/25/2024 00:31   DG HIP UNILAT WITH PELVIS 2-3 VIEWS  LEFT Result Date: 05/24/2024 CLINICAL DATA:  Pain. EXAM: DG HIP (WITH OR WITHOUT PELVIS) 2-3V LEFT; DG HIP (WITH OR WITHOUT PELVIS) 2-3V RIGHT COMPARISON:  None Available. FINDINGS: Right hip: Slight joint space narrowing with acetabular spurring. No fracture or dislocation. Pubic rami are intact. No erosive or bony destructive change. There is a globular focus of air projecting inferior to the right ischium in the soft tissues. Left hip: Hip hemiarthroplasty in expected alignment. No periprosthetic lucency. No acute or periprosthetic fracture. Pubic rami are intact. Vascular calcifications are seen. IMPRESSION: 1. Mild osteoarthritis of the right hip. 2. Left hip hemiarthroplasty without complication. 3. Focus of air projecting inferior to the right ischium in the soft tissues. This may be related to a decubitus ulcer, inguinal hernia or less  likely soft tissue infection given morphology. Recommend correlation with physical exam. As clinically indicated, consider CT for further assessment. Electronically Signed   By: Andrea Gasman M.D.   On: 05/24/2024 19:20   DG HIP UNILAT WITH PELVIS 2-3 VIEWS RIGHT Result Date: 05/24/2024 CLINICAL DATA:  Pain. EXAM: DG HIP (WITH OR WITHOUT PELVIS) 2-3V LEFT; DG HIP (WITH OR WITHOUT PELVIS) 2-3V RIGHT COMPARISON:  None Available. FINDINGS: Right hip: Slight joint space narrowing with acetabular spurring. No fracture or dislocation. Pubic rami are intact. No erosive or bony destructive change. There is a globular focus of air projecting inferior to the right ischium in the soft tissues. Left hip: Hip hemiarthroplasty in expected alignment. No periprosthetic lucency. No acute or periprosthetic fracture. Pubic rami are intact. Vascular calcifications are seen. IMPRESSION: 1. Mild osteoarthritis of the right hip. 2. Left hip hemiarthroplasty without complication. 3. Focus of air projecting inferior to the right ischium in the soft tissues. This may be related to a decubitus ulcer, inguinal hernia or less likely soft tissue infection given morphology. Recommend correlation with physical exam. As clinically indicated, consider CT for further assessment. Electronically Signed   By: Andrea Gasman M.D.   On: 05/24/2024 19:20   CT Cervical Spine Wo Contrast Result Date: 05/24/2024 CLINICAL DATA:  Recurrent urosepsis. EXAM: CT CERVICAL SPINE WITHOUT CONTRAST TECHNIQUE: Multidetector CT imaging of the cervical spine was performed without intravenous contrast. Multiplanar CT image reconstructions were also generated. RADIATION DOSE REDUCTION: This exam was performed according to the departmental dose-optimization program which includes automated exposure control, adjustment of the mA and/or kV according to patient size and/or use of iterative reconstruction technique. COMPARISON:  None Available. FINDINGS: Alignment:  Approximately 2 mm stepwise anterolisthesis is seen at the levels of C4-C5 and C5-C6. Skull base and vertebrae: No acute fracture. No primary bone lesion or focal pathologic process. Soft tissues and spinal canal: No prevertebral fluid or swelling. No visible canal hematoma. Disc levels: Marked severity endplate sclerosis is seen at the level of C6-C7, with mild to moderate severity anterior osteophyte formation and posterior bony spurring noted at the levels of C4-C5, C5-C6 and C6-C7. There is marked severity narrowing of the anterior atlantoaxial articulation. Moderate severity intervertebral disc space narrowing is seen at C5-C6, with marked severity intervertebral disc space narrowing at C6-C7. Bilateral marked severity multilevel facet joint hypertrophy is noted. Upper chest: Marked severity biapical infiltrates are seen with large bilateral pleural effusions. Other: None. IMPRESSION: 1. No acute cervical spine fracture. 2. Marked severity multilevel degenerative changes, most prominent at the levels of C4-C5, C5-C6 and C6-C7. 3. Marked severity biapical infiltrates with large bilateral pleural effusions. Electronically Signed   By: Suzen Dials M.D.   On: 05/24/2024  19:18   DG Chest 2 View Result Date: 05/24/2024 CLINICAL DATA:  Provided history: Suspected Sepsis EXAM: CHEST - 2 VIEW COMPARISON:  01/28/2020 FINDINGS: Lung volumes are low. The heart is enlarged. Diffuse peribronchial and interstitial thickening throughout the left lung, right lung base and right upper lung zone. There are small bilateral pleural effusions. Biapical pleuroparenchymal scarring. No pneumothorax. IMPRESSION: 1. Diffuse peribronchial and interstitial thickening throughout the left lung, right lung base and right upper lung zone. Findings may represent atypical infection or pulmonary edema. 2. Small bilateral pleural effusions. 3. Cardiomegaly. Electronically Signed   By: Andrea Gasman M.D.   On: 05/24/2024 19:17   CT  Head Wo Contrast Result Date: 05/24/2024 CLINICAL DATA:  Recurrent urosepsis. EXAM: CT HEAD WITHOUT CONTRAST TECHNIQUE: Contiguous axial images were obtained from the base of the skull through the vertex without intravenous contrast. RADIATION DOSE REDUCTION: This exam was performed according to the departmental dose-optimization program which includes automated exposure control, adjustment of the mA and/or kV according to patient size and/or use of iterative reconstruction technique. COMPARISON:  None Available. FINDINGS: Brain: There is generalized cerebral atrophy with widening of the extra-axial spaces and ventricular dilatation. There are areas of decreased attenuation within the white matter tracts of the supratentorial brain, consistent with microvascular disease changes. 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 Vascular: No hyperdense vessel or unexpected calcification. Skull: Normal. Negative for fracture or focal lesion. Sinuses/Orbits: No acute finding. Other: None. IMPRESSION: 1. Generalized cerebral atrophy and microvascular disease changes of the supratentorial brain. 2. No acute intracranial abnormality. Electronically Signed   By: Suzen Dials M.D.   On: 05/24/2024 19:13    Micro Results    Recent Results (from the past 240 hours)  Urine Culture     Status: Abnormal   Collection Time: 05/24/24  5:26 PM   Specimen: Urine, Random  Result Value Ref Range Status   Specimen Description   Final    URINE, RANDOM Performed at [facility], [address], [place], [place] [address]    Special Requests   Final    NONE Reflexed from [id number] Performed at [facility], [address], [place], [place] [address]    Culture MULTIPLE SPECIES PRESENT, SUGGEST RECOLLECTION (A)  Final   Report Status 05/26/2024 FINAL  Final  Culture, blood (Routine x 2)     Status: None   Collection Time: 05/24/24  5:38 PM   Specimen: Left Antecubital; Blood   Result Value Ref Range Status   Specimen Description LEFT ANTECUBITAL  Final   Special Requests   Final    BOTTLES DRAWN AEROBIC AND ANAEROBIC Blood Culture adequate volume   Culture   Final    NO GROWTH 5 DAYS Performed at [facility], [address] [place], [place] [address]    Report Status 05/29/2024 FINAL  Final  Resp panel by RT-PCR (RSV, Flu A&B, Covid) Anterior Nasal Swab     Status: None   Collection Time: 05/24/24  5:43 PM   Specimen: Anterior Nasal Swab  Result Value Ref Range Status   SARS Coronavirus 2 by RT PCR NEGATIVE NEGATIVE Final    Comment: (NOTE) SARS-CoV-2 target nucleic acids are NOT DETECTED.  The SARS-CoV-2 RNA is generally detectable in upper respiratory specimens during the acute phase of infection. The lowest concentration of SARS-CoV-2 viral copies this assay can detect is 138 copies/mL. A negative result does not preclude SARS-Cov-2 infection and should not be  used as the sole basis for treatment or other patient management decisions. A negative result may occur with  improper specimen collection/handling, submission of specimen other than nasopharyngeal swab, presence of viral mutation(s) within the areas targeted by this assay, and inadequate number of viral copies(<138 copies/mL). A negative result must be combined with clinical observations, patient history, and epidemiological information. The expected result is Negative.  Fact Sheet for Patients:  BloggerCourse.com  Fact Sheet for Healthcare Providers:  SeriousBroker.it  This test is no t yet approved or cleared by the [place]  FDA and  has been authorized for detection and/or diagnosis of SARS-CoV-2 by FDA under an Emergency Use Authorization (EUA). This EUA will remain  in effect (meaning this test can be used) for the duration of the COVID-19 declaration under Section 564(b)(1) of the Act, 21 U.S.C.section 360bbb-3(b)(1), unless  the authorization is terminated  or revoked sooner.       Influenza A by PCR NEGATIVE NEGATIVE Final   Influenza B by PCR NEGATIVE NEGATIVE Final    Comment: (NOTE) The Xpert Xpress SARS-CoV-2/FLU/RSV plus assay is intended as an aid in the diagnosis of influenza from Nasopharyngeal swab specimens and should not be used as a sole basis for treatment. Nasal washings and aspirates are unacceptable for Xpert Xpress SARS-CoV-2/FLU/RSV testing.  Fact Sheet for Patients: BloggerCourse.com  Fact Sheet for Healthcare Providers: SeriousBroker.it  This test is not yet approved or cleared by the [place]  FDA and has been authorized for detection and/or diagnosis of SARS-CoV-2 by FDA under an Emergency Use Authorization (EUA). This EUA will remain in effect (meaning this test can be used) for the duration of the COVID-19 declaration under Section 564(b)(1) of the Act, 21 U.S.C. section 360bbb-3(b)(1), unless the authorization is terminated or revoked.     Resp Syncytial Virus by PCR NEGATIVE NEGATIVE Final    Comment: (NOTE) Fact Sheet for Patients: BloggerCourse.com  Fact Sheet for Healthcare Providers: SeriousBroker.it  This test is not yet approved or cleared by the [place]  FDA and has been authorized for detection and/or diagnosis of SARS-CoV-2 by FDA under an Emergency Use Authorization (EUA). This EUA will remain in effect (meaning this test can be used) for the duration of the COVID-19 declaration under Section 564(b)(1) of the Act, 21 U.S.C. section 360bbb-3(b)(1), unless the authorization is terminated or revoked.  Performed at [facility], [address] [place], [place] [address]   Culture, blood (Routine x 2)     Status: None   Collection Time: 05/24/24  6:02 PM   Specimen: Right Antecubital; Blood  Result Value Ref Range Status   Specimen Description RIGHT  ANTECUBITAL AEROBIC BOTTLE ONLY  Final   Special Requests   Final    Blood Culture results may not be optimal due to an inadequate volume of blood received in culture bottles   Culture   Final    NO GROWTH 5 DAYS Performed at [facility], [address] [place], [place] [address]    Report Status 05/29/2024 FINAL  Final  MRSA Next Gen by PCR, Nasal     Status: None   Collection Time: 05/25/24  2:03 AM   Specimen: Nasal Mucosa; Nasal Swab  Result Value Ref Range Status   MRSA by PCR Next Gen NOT DETECTED NOT DETECTED Final    Comment: (NOTE) The GeneXpert MRSA Assay (FDA approved for NASAL specimens only), is one component of a comprehensive MRSA colonization surveillance program. It is not intended to diagnose MRSA infection nor to  guide or monitor treatment for MRSA infections. Test performance is not FDA approved in patients less than 60 years old. Performed at [facility], [address], [place], [place] [address]   Culture, Respiratory w Gram Stain (tracheal aspirate)     Status: None   Collection Time: 05/25/24  2:48 AM   Specimen: Tracheal Aspirate; Respiratory  Result Value Ref Range Status   Specimen Description TRACHEAL ASPIRATE  Final   Special Requests NONE  Final   Gram Stain   Final    RARE WBC PRESENT, PREDOMINANTLY PMN NO ORGANISMS SEEN Performed at [facility], [address], [place], [place] [address]    Culture RARE CANDIDA TROPICALIS  Final   Report Status 05/27/2024 FINAL  Final  Respiratory (~20 pathogens) panel by PCR     Status: None   Collection Time: 05/26/24 12:16 PM   Specimen: Nasopharyngeal Swab; Respiratory  Result Value Ref Range Status   Adenovirus NOT DETECTED NOT DETECTED Final   Coronavirus 229E NOT DETECTED NOT DETECTED Final    Comment: (NOTE) The Coronavirus on the Respiratory Panel, DOES NOT test for the novel  Coronavirus (2019 nCoV)    Coronavirus HKU1 NOT DETECTED NOT DETECTED Final   Coronavirus NL63 NOT  DETECTED NOT DETECTED Final   Coronavirus OC43 NOT DETECTED NOT DETECTED Final   Metapneumovirus NOT DETECTED NOT DETECTED Final   Rhinovirus / Enterovirus NOT DETECTED NOT DETECTED Final   Influenza A NOT DETECTED NOT DETECTED Final   Influenza B NOT DETECTED NOT DETECTED Final   Parainfluenza Virus 1 NOT DETECTED NOT DETECTED Final   Parainfluenza Virus 2 NOT DETECTED NOT DETECTED Final   Parainfluenza Virus 3 NOT DETECTED NOT DETECTED Final   Parainfluenza Virus 4 NOT DETECTED NOT DETECTED Final   Respiratory Syncytial Virus NOT DETECTED NOT DETECTED Final   Bordetella pertussis NOT DETECTED NOT DETECTED Final   Bordetella Parapertussis NOT DETECTED NOT DETECTED Final   Chlamydophila pneumoniae NOT DETECTED NOT DETECTED Final   Mycoplasma pneumoniae NOT DETECTED NOT DETECTED Final    Comment: Performed at [facility], [address], [place], [place] [address]  Urine Culture     Status: None   Collection Time: 05/26/24 12:16 PM   Specimen: Urine, Random  Result Value Ref Range Status   Specimen Description URINE, RANDOM  Final   Special Requests NONE Reflexed from [id number]  Final   Culture   Final    NO GROWTH Performed at [facility], [address] [place], [place] [address]    Report Status 05/27/2024 FINAL  Final    Today   Subjective    Anthony Fields today has no headache,no chest abdominal pain,no new weakness tingling or numbness, feels much better wants to go home today.    Objective   Blood pressure (!) 108/56, pulse 89, temperature 97.8 F (36.6 C), temperature source Axillary, resp. rate (!) 24, height 5' 8 (1.727 m), weight 79.6 kg, SpO2 95%.   Intake/Output Summary (Last 24 hours) at 06/01/2024 0857 Last data filed at 06/01/2024 0607 Gross per 24 hour  Intake --  Output 1150 ml  Net -1150 ml    Exam  Awake Alert, No new F.N deficits,    [place].AT,PERRAL Supple Neck,   Symmetrical Chest wall movement, Good air movement bilaterally, CTAB RRR,No  Gallops,   +ve B.Sounds, Abd Soft, Non tender,  No Cyanosis, Clubbing or edema    Data Review   Recent Labs  Lab  05/27/24 9660 05/28/24 0824 05/29/24 0404 05/30/24 0433 05/31/24 0519  WBC 7.8 9.1 10.0 9.4 9.4  HGB 9.1* 10.7* 11.0* 10.6* 9.3*  HCT 29.3* 32.4* 32.6* 30.7* 28.4*  PLT 142* 224 216 218 180  MCV 94.2 91.3 90.8 89.8 91.9  MCH 29.3 30.1 30.6 31.0 30.1  MCHC 31.1 33.0 33.7 34.5 32.7  RDW 16.7* 17.3* 18.0* 18.3* 18.8*  LYMPHSABS 1.7 1.9 2.4 2.1 2.3  MONOABS 0.6 0.7 0.9 0.8 0.7  EOSABS 0.5 0.3 0.5 0.2 0.6*  BASOSABS 0.1 0.1 0.1 0.1 0.1    Recent Labs  Lab 05/25/24 0944 05/26/24 0404 05/26/24 0943 05/27/24 0339 05/28/24 0824 05/29/24 0404 05/29/24 0642 05/30/24 0433 05/31/24 0519 06/01/24 0602  NA 140   < >  --  137 141  --  143 143 141 140  K 4.0   < >  --  3.6 3.3*  --  3.4* 3.9 3.7 4.0  CL 110   < >  --  104 103  --  107 105 106 107  CO2 20*   < >  --  21* 23  --  27 30 27 27   ANIONGAP 10   < >  --  12 15  --  9 8 8 6   GLUCOSE 101*   < >  --  90 68*  --  92 103* 95 85  BUN 14   < >  --  10 11  --  10 12 11 10   CREATININE 0.79   < >  --  0.76 0.68  --  0.79 0.84 0.64 0.61  CRP  --   --   --   --  11.2* 9.9*  --  8.5* 6.8*  --   PROCALCITON <0.10  --   --   --  <0.10 0.10  --  <0.10 <0.10  --   LATICACIDVEN 1.9  --   --   --   --   --   --   --   --   --   TSH  --   --   --   --   --   --   --   --  6.882*  --   BNP  --   --  346.4*  --  838.4* 892.2*  --  817.6* 381.0*  --   MG 1.4*   < >  --  2.1  --  1.5*  --  1.9 1.6* 2.1  PHOS 2.8  --  2.7 3.5 2.2* 1.6*  --  2.0* 2.6  --   CALCIUM  8.7*   < >  --  8.9 9.0  --  8.8* 8.8* 8.3* 8.4*   < > = values in this interval not displayed.    Total Time in preparing paper work, data evaluation and todays exam - 35 minutes  Signature  -    Lavada Stank M.D on 06/01/2024 at 8:57 AM   -  To page go to [email or web address]

## 2024-06-01 NOTE — Final Progress Note (Signed)
 SATURATION QUALIFICATIONS: (This note is used to comply with regulatory documentation for home oxygen)  Patient Saturations on Room Air at Rest = 90%  Patient Saturations on Room Air while Ambulating = 86%  Patient Saturations on 3 Liters of oxygen while Ambulating = 94%  Please briefly explain why patient needs home oxygen: Patient does not maintain sufficient oxygen saturation without the use of 3L nasal cannula. It also takes pt about 1 minute to recover when oxygen hasn't been applied and is reapplied after ambulation.

## 2024-06-14 LAB — HAPTOGLOBIN

## 2024-06-21 ENCOUNTER — Ambulatory Visit (HOSPITAL_COMMUNITY): Attending: Physician Assistant | Admitting: Physician Assistant

## 2024-06-21 ENCOUNTER — Encounter (HOSPITAL_COMMUNITY): Payer: Self-pay

## 2024-06-21 NOTE — Progress Notes (Incomplete)
 Primary Care Physician: Maree Virgilio SAUNDERS, MD Primary Cardiologist: None Electrophysiologist: None  Referring Physician: Dr Loni Johnnye Moats is a 89 y.o. male with a history of HTN, HLD, CHF, atrial fibrillation who presents for follow up in the [facility].  The patient was initially diagnosed with atrial fibrillation roughly one year ago and is on Eliquis  for stroke prevention. He was admitted with urosepsis and septic shock 05/24/24. His heart rates were rapid at that time. Echo showed LVEF 45%. He was started on metoprolol  and amiodarone .    Patient presents today for follow up for atrial fibrillation. ***  Today, he denies symptoms of ***palpitations, chest pain, shortness of breath, orthopnea, PND, lower extremity edema, dizziness, presyncope, syncope, snoring, daytime somnolence, bleeding, or neurologic sequela. The patient is tolerating medications without difficulties and is otherwise without complaint today.    Atrial Fibrillation Risk Factors:  he does not have symptoms or diagnosis of sleep apnea. he does not have a history of rheumatic fever.   Atrial Fibrillation Management history:  Previous antiarrhythmic drugs: amiodarone   Previous cardioversions: none Previous ablations: none Anticoagulation history: warfarin, Eliquis   ROS- All systems are reviewed and negative except as per the HPI above.  Past Medical History:  Diagnosis Date   Acid reflux    Atrial fibrillation (HCC)    Hand deformity, congenital    right hand -fingers   High cholesterol    Hypertension     Current Outpatient Medications  Medication Sig Dispense Refill   amiodarone  (PACERONE ) 200 MG tablet Take 1 tablet (200 mg total) by mouth 2 (two) times daily for 7 days, THEN 1 tablet (200 mg total) daily. 44 tablet 0   amoxicillin -clavulanate (AUGMENTIN ) 875-125 MG tablet Take 1 tablet by mouth every 12 (twelve) hours. 6 tablet 0   atorvastatin  (LIPITOR) 40 MG tablet  Take 40 mg by mouth daily.     cholecalciferol (VITAMIN D3) 25 MCG (1000 UNIT) tablet Take 1,000 Units by mouth daily.     ELIQUIS  5 MG TABS tablet Take 5 mg by mouth 2 (two) times daily.     fenofibrate  (TRICOR ) 145 MG tablet Take 145 mg by mouth daily.     furosemide  (LASIX ) 40 MG tablet Take 1 tablet (40 mg total) by mouth daily as needed for fluid or edema (swelling, sob). 30 tablet 0   ipratropium-albuterol  (DUONEB) 0.5-2.5 (3) MG/3ML SOLN Use one vial via nebulization twice a day scheduled and every 4 hours as needed for shortness of breath and wheezing 360 mL 0   metoprolol  succinate (TOPROL -XL) 25 MG 24 hr tablet Take 25 mg by mouth daily.     midodrine  (PROAMATINE ) 5 MG tablet Take 1 tablet (5 mg total) by mouth 2 (two) times daily with a meal. 60 tablet 0   niacin (NIASPAN) 1000 MG CR tablet Take 1,000 mg by mouth at bedtime.     oxybutynin  (DITROPAN ) 5 MG tablet Take 5 mg by mouth 2 (two) times daily.     pantoprazole  (PROTONIX ) 40 MG tablet Take 40 mg by mouth daily.     polyethylene glycol powder (GLYCOLAX /MIRALAX ) 17 GM/SCOOP powder Take 17 g by mouth daily as needed. 238 g 0   tamsulosin  (FLOMAX ) 0.4 MG CAPS capsule Take 0.4 mg by mouth daily after supper.     No current facility-administered medications for this visit.    Physical Exam: There were no vitals taken for this visit.  GEN: Well nourished, well developed in  no acute distress NECK: No JVD; No carotid bruits CARDIAC: {EPRHYTHM:28826}, no murmurs, rubs, gallops RESPIRATORY:  Clear to auscultation without rales, wheezing or rhonchi  ABDOMEN: Soft, non-tender, non-distended EXTREMITIES:  No edema; No deformity   Wt Readings from Last 3 Encounters:  06/01/24 79.6 kg  09/02/23 84.6 kg  02/23/22 81.6 kg     EKG today demonstrates  ***  Echo 05/25/24 demonstrated   1. Left ventricular ejection fraction, by estimation, is 45 to 50%. Left  ventricular ejection fraction by 2D MOD biplane is 38.7 %. The left   ventricle has mildly decreased function. The left ventricle demonstrates  global hypokinesis. There is mild  asymmetric left ventricular hypertrophy of the septal segment. Left  ventricular diastolic parameters are indeterminate.   2. Right ventricular systolic function is mildly reduced. The right  ventricular size is normal. There is mildly elevated pulmonary artery  systolic pressure. The estimated right ventricular systolic pressure is  43.5 mmHg.   3. Left atrial size was severely dilated.   4. Right atrial size was severely dilated.   5. The mitral valve is abnormal. Mild mitral valve regurgitation.   6. The tricuspid valve is abnormal. Tricuspid valve regurgitation is  moderate to severe.   7. The aortic valve is tricuspid. Aortic valve regurgitation is mild.  Aortic valve sclerosis is present, with no evidence of aortic valve  stenosis.   8. There is mild dilatation of the ascending aorta, measuring 40 mm.   9. The inferior vena cava is dilated in size with <50% respiratory  variability, suggesting right atrial pressure of 15 mmHg.     CHA2DS2-VASc Score =    The patient's score is based upon:   {Click here to calculate score.  REFRESH note before signing. :1}    ***chf, htn ASSESSMENT AND PLAN: {Select the correct AFib Diagnosis                 :[id number]}   General education about afib provided and questions answered. We also discussed his stroke risk and the risks and benefits of anticoagulation. Patient remains in afib today. We discussed rate vs rhythm control. Will plan for DCCV. Check bmet/cbc*** Continue amiodarone  200 mg daily Continue Eliquis  5 mg BID Continue Toprol  25 mg daily  High Risk Medication Monitoring (ICD 10: Z79.899) Intervals on ECG acceptable for amiodarone  monitoring. 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 stress test, cath, DCCV, TEE, etc)?   Press F2        :[id number]}    [facility] [address] [place], [place] [address] [phone]

## 2024-07-01 ENCOUNTER — Other Ambulatory Visit (HOSPITAL_COMMUNITY): Payer: Self-pay

## 2024-07-12 ENCOUNTER — Emergency Department (HOSPITAL_COMMUNITY)

## 2024-07-12 ENCOUNTER — Other Ambulatory Visit: Payer: Self-pay

## 2024-07-12 ENCOUNTER — Encounter (HOSPITAL_COMMUNITY): Payer: Self-pay

## 2024-07-12 ENCOUNTER — Emergency Department (HOSPITAL_COMMUNITY)
Admission: EM | Admit: 2024-07-12 | Discharge: 2024-07-12 | Disposition: A | Discharge status: Home / Self Care | Attending: Emergency Medicine | Admitting: Emergency Medicine

## 2024-07-12 DIAGNOSIS — Z7901 Long term (current) use of anticoagulants: Secondary | ICD-10-CM | POA: Diagnosis not present

## 2024-07-12 DIAGNOSIS — R600 Localized edema: Secondary | ICD-10-CM | POA: Insufficient documentation

## 2024-07-12 DIAGNOSIS — R192 Visible peristalsis: Secondary | ICD-10-CM | POA: Insufficient documentation

## 2024-07-12 DIAGNOSIS — R1030 Lower abdominal pain, unspecified: Secondary | ICD-10-CM | POA: Diagnosis present

## 2024-07-12 DIAGNOSIS — R042 Hemoptysis: Secondary | ICD-10-CM | POA: Diagnosis not present

## 2024-07-12 DIAGNOSIS — R0602 Shortness of breath: Secondary | ICD-10-CM | POA: Insufficient documentation

## 2024-07-12 DIAGNOSIS — N3000 Acute cystitis without hematuria: Secondary | ICD-10-CM | POA: Insufficient documentation

## 2024-07-12 LAB — URINALYSIS, W/ REFLEX TO CULTURE (INFECTION SUSPECTED)
Bilirubin Urine: NEGATIVE
Glucose, UA: NEGATIVE mg/dL
Ketones, ur: NEGATIVE mg/dL
Nitrite: NEGATIVE
Protein, ur: 30 mg/dL — AB
Specific Gravity, Urine: 1.008 (ref 1.005–1.030)
WBC, UA: >50 WBC/hpf (ref 0–5)
pH: 7 (ref 5.0–8.0)

## 2024-07-12 LAB — COMPREHENSIVE METABOLIC PANEL WITH GFR
ALT: 39 U/L (ref 0–44)
AST: 52 U/L — ABNORMAL HIGH (ref 15–41)
Albumin: 2.3 g/dL — ABNORMAL LOW (ref 3.5–5.0)
Alkaline Phosphatase: 55 U/L (ref 38–126)
Anion gap: 11 (ref 5–15)
BUN: 10 mg/dL (ref 8–23)
CO2: 25 mmol/L (ref 22–32)
Calcium: 8.8 mg/dL — ABNORMAL LOW (ref 8.9–10.3)
Chloride: 96 mmol/L — ABNORMAL LOW (ref 98–111)
Creatinine, Ser: 0.74 mg/dL (ref 0.61–1.24)
GFR, Estimated: >60 mL/min (ref >60)
Glucose, Bld: 111 mg/dL — ABNORMAL HIGH (ref 70–99)
Potassium: 3.7 mmol/L (ref 3.5–5.1)
Sodium: 132 mmol/L — ABNORMAL LOW (ref 135–145)
Total Bilirubin: 1.4 mg/dL — ABNORMAL HIGH (ref 0.0–1.2)
Total Protein: 5.8 g/dL — ABNORMAL LOW (ref 6.5–8.1)

## 2024-07-12 LAB — CBC WITH DIFFERENTIAL/PLATELET
Abs Immature Granulocytes: 0.02 K/uL (ref 0.00–0.07)
Basophils Absolute: 0 K/uL (ref 0.0–0.1)
Basophils Relative: 1 %
Eosinophils Absolute: 0.2 K/uL (ref 0.0–0.5)
Eosinophils Relative: 2 %
HCT: 35.2 % — ABNORMAL LOW (ref 39.0–52.0)
Hemoglobin: 11.4 g/dL — ABNORMAL LOW (ref 13.0–17.0)
Immature Granulocytes: 0 %
Lymphocytes Relative: 14 %
Lymphs Abs: 1 K/uL (ref 0.7–4.0)
MCH: 30.6 pg (ref 26.0–34.0)
MCHC: 32.4 g/dL (ref 30.0–36.0)
MCV: 94.4 fL (ref 80.0–100.0)
Monocytes Absolute: 0.6 K/uL (ref 0.1–1.0)
Monocytes Relative: 8 %
Neutro Abs: 5.3 K/uL (ref 1.7–7.7)
Neutrophils Relative %: 75 %
Platelets: 267 K/uL (ref 150–400)
RBC: 3.73 MIL/uL — ABNORMAL LOW (ref 4.22–5.81)
RDW: 16.1 % — ABNORMAL HIGH (ref 11.5–15.5)
WBC: 7 K/uL (ref 4.0–10.5)
nRBC: 0 % (ref 0.0–0.2)

## 2024-07-12 LAB — D-DIMER, QUANTITATIVE: D-Dimer, Quant: 0.77 ug{FEU}/mL — ABNORMAL HIGH (ref 0.00–0.50)

## 2024-07-12 LAB — PROTIME-INR
INR: 2.3 — ABNORMAL HIGH (ref 0.8–1.2)
Prothrombin Time: 26.5 s — ABNORMAL HIGH (ref 11.4–15.2)

## 2024-07-12 LAB — BRAIN NATRIURETIC PEPTIDE: B Natriuretic Peptide: 386.4 pg/mL — ABNORMAL HIGH (ref 0.0–100.0)

## 2024-07-12 LAB — I-STAT CG4 LACTIC ACID, ED
Lactic Acid, Venous: 1.7 mmol/L (ref 0.5–1.9)
Lactic Acid, Venous: 1.3 mmol/L (ref 0.5–1.9)

## 2024-07-12 LAB — TROPONIN I (HIGH SENSITIVITY)
Troponin I (High Sensitivity): 11 ng/L (ref <18)
Troponin I (High Sensitivity): 9 ng/L (ref <18)

## 2024-07-12 MED ORDER — FUROSEMIDE 40 MG PO TABS: 80.0000 mg | ORAL_TABLET | Freq: Every day | ORAL | 0 refills | Status: AC | PRN

## 2024-07-12 MED ORDER — FUROSEMIDE 10 MG/ML IJ SOLN
40.0000 mg | Freq: Once | INTRAMUSCULAR | Status: AC
  Administered 2024-07-12: 40 mg via INTRAVENOUS
  Filled 2024-07-12: qty 4

## 2024-07-12 MED ORDER — CEFPODOXIME PROXETIL 100 MG PO TABS: 100.0000 mg | ORAL_TABLET | Freq: Two times a day (BID) | ORAL | 0 refills | Status: DC

## 2024-07-12 NOTE — ED Provider Notes (Signed)
 [place] [facility] AT [facility] Provider Note   CSN: [id number] Arrival date & time: 07/12/24  9166     Patient presents with: No chief complaint on file.   Anthony Fields is a 89 y.o. male.   89 yo M with a chief complaints of edema and difficulty breathing.  This has been going on for months but got worse over the past couple days.  No fevers.  Has been having some leakage of fluid from the left thigh.  Also has been starting to have some right sided groin pain.  Family has noticed a bulge here at times.  Has had some trouble with constipation.  Had a good bowel movement yesterday.  No nausea or vomiting.  Patient gets scheduled nebs.  He has been coughing up blood usually after the nebs.  No chest pain.  Patient also reportedly fell yesterday.  Denies any injury in the fall.  Family also endorses recurrent urinary tract infections that required hospitalization and IV antibiotics.  Felt his urine smelled like that and so they brought him here this morning to be evaluated.        Prior to Admission medications   Medication Sig Start Date End Date Taking? Authorizing Provider  amiodarone  (PACERONE ) 200 MG tablet Take 1 tablet (200 mg total) by mouth 2 (two) times daily for 7 days, THEN 1 tablet (200 mg total) daily. 06/01/24 07/08/24  Singh, Prashant K, MD  amoxicillin -clavulanate (AUGMENTIN ) 875-125 MG tablet Take 1 tablet by mouth every 12 (twelve) hours. 06/01/24   Singh, Prashant K, MD  Ascorbic Acid (VITAMIN C) 1000 MG tablet Take 1,000 mg by mouth daily. 07/01/24   [provider]  atorvastatin  (LIPITOR) 40 MG tablet Take 40 mg by mouth daily.    [provider]  cefpodoxime  (VANTIN ) 100 MG tablet Take 1 tablet (100 mg total) by mouth 2 (two) times daily. 07/12/24   Emil Share, DO  cholecalciferol (VITAMIN D3) 25 MCG (1000 UNIT) tablet Take 1,000 Units by mouth daily.    [provider]  ELIQUIS  5 MG TABS tablet Take 5 mg by mouth 2  (two) times daily.    [provider]  fenofibrate  (TRICOR ) 145 MG tablet Take 145 mg by mouth daily.    [provider]  furosemide  (LASIX ) 40 MG tablet Take 2 tablets (80 mg total) by mouth daily as needed for fluid or edema (swelling, sob). 07/12/24   Emil Share, DO  ipratropium-albuterol  (DUONEB) 0.5-2.5 (3) MG/3ML SOLN Use one vial via nebulization twice a day scheduled and every 4 hours as needed for shortness of breath and wheezing 06/01/24   Singh, Prashant K, MD  metoprolol  succinate (TOPROL -XL) 25 MG 24 hr tablet Take 25 mg by mouth daily.    [provider]  midodrine  (PROAMATINE ) 5 MG tablet Take 1 tablet (5 mg total) by mouth 2 (two) times daily with a meal. 06/01/24   Singh, Prashant K, MD  niacin (NIASPAN) 1000 MG CR tablet Take 1,000 mg by mouth at bedtime.    [provider]  oxybutynin  (DITROPAN ) 5 MG tablet Take 5 mg by mouth 2 (two) times daily. 05/03/24   [provider]  pantoprazole  (PROTONIX ) 40 MG tablet Take 40 mg by mouth daily.    [provider]  polyethylene glycol powder (GLYCOLAX /MIRALAX ) 17 GM/SCOOP powder Take 17 g by mouth daily as needed. 06/01/24   Singh, Prashant K, MD  tamsulosin  (FLOMAX ) 0.4 MG CAPS capsule Take 0.4 mg  by mouth daily after supper.    [provider]  VITAMIN C/NATURAL ROSE HIPS 1000 MG tablet Take 1,000 mg by mouth daily. 07/01/24   [provider]    Allergies: Patient has no known allergies.    Review of Systems  Updated Vital Signs BP 109/64   Pulse 79   Temp 97.9 F (36.6 C)   Resp 16   Ht 5' 8 (1.727 m)   Wt 78.9 kg   SpO2 100%   BMI 26.46 kg/m   Physical Exam Vitals and nursing note reviewed.  Constitutional:      Appearance: He is well-developed.     Comments: Chronically ill-appearing  HENT:     Head: Normocephalic and atraumatic.  Eyes:     Pupils: Pupils are equal, round, and reactive to light.  Neck:     Vascular: No JVD.  Cardiovascular:     Rate  and Rhythm: Normal rate and regular rhythm.     Heart sounds: No murmur heard.    No friction rub. No gallop.  Pulmonary:     Effort: No respiratory distress.     Breath sounds: No wheezing.  Abdominal:     General: There is no distension.     Tenderness: There is no abdominal tenderness. There is no guarding or rebound.     Comments: No obvious bulge noted to the right inguinal region.  Patient did have some peristalsis under my fingers.  Family had noticed that before when they pushed on the bulge.  No obvious pain to the penis or testicles.  Musculoskeletal:        General: Normal range of motion.     Cervical back: Normal range of motion and neck supple.  Skin:    Coloration: Skin is not pale.     Findings: No rash.  Neurological:     Mental Status: He is alert and oriented to person, place, and time.  Psychiatric:        Behavior: Behavior normal.     (all labs ordered are listed, but only abnormal results are displayed) Labs Reviewed  COMPREHENSIVE METABOLIC PANEL WITH GFR - Abnormal; Notable for the following components:      Result Value   Sodium 132 (*)    Chloride 96 (*)    Glucose, Bld 111 (*)    Calcium  8.8 (*)    Total Protein 5.8 (*)    Albumin  2.3 (*)    AST 52 (*)    Total Bilirubin 1.4 (*)    All other components within normal limits  CBC WITH DIFFERENTIAL/PLATELET - Abnormal; Notable for the following components:   RBC 3.73 (*)    Hemoglobin 11.4 (*)    HCT 35.2 (*)    RDW 16.1 (*)    All other components within normal limits  PROTIME-INR - Abnormal; Notable for the following components:   Prothrombin Time 26.5 (*)    INR 2.3 (*)    All other components within normal limits  URINALYSIS, W/ REFLEX TO CULTURE (INFECTION SUSPECTED) - Abnormal; Notable for the following components:   APPearance CLOUDY (*)    Hgb urine dipstick LARGE (*)    Protein, ur 30 (*)    Leukocytes,Ua LARGE (*)    Bacteria, UA MANY (*)    Non Squamous Epithelial 0-5 (*)    All  other components within normal limits  BRAIN NATRIURETIC PEPTIDE - Abnormal; Notable for the following components:   B Natriuretic Peptide 386.4 (*)  All other components within normal limits  D-DIMER, QUANTITATIVE - Abnormal; Notable for the following components:   D-Dimer, Quant 0.77 (*)    All other components within normal limits  CULTURE, BLOOD (ROUTINE X 2)  CULTURE, BLOOD (ROUTINE X 2)  URINE CULTURE  I-STAT CG4 LACTIC ACID, ED  I-STAT CG4 LACTIC ACID, ED  TROPONIN I (HIGH SENSITIVITY)  TROPONIN I (HIGH SENSITIVITY)    EKG: EKG Interpretation Date/Time:  Monday July 12 2024 09:31:55 EDT Ventricular Rate:  83 PR Interval:    QRS Duration:  112 QT Interval:  491 QTC Calculation: 577 R Axis:   116  Text Interpretation: Atrial fibrillation Borderline intraventricular conduction delay Low voltage, extremity leads Nonspecific T abnrm, anterolateral leads Prolonged QT interval No significant change since last tracing Confirmed by Emil Share [phone]) on 07/12/2024 10:31:44 AM  Radiology: [facility] Chest Port 1 View Result Date: 07/12/2024 CLINICAL DATA:  Possible sepsis. Short of breath. Chronically on oxygen. Coughing up blood for 3 days. EXAM: PORTABLE CHEST 1 VIEW COMPARISON:  05/31/2024 and older studies.  CT, 05/26/2024. FINDINGS: Cardiac silhouette is mildly enlarged. No mediastinal or hilar masses. Bilateral coarse interstitial thickening with hazy intervening airspace opacities is similar to the prior exams. Possible small effusions. No pneumothorax. Skeletal structures are grossly intact. IMPRESSION: 1. Findings are similar to the prior chest radiograph. 2. Coarse bilateral interstitial thickening with intervening hazy airspace opacities. Findings consistent with congestive heart failure with pulmonary edema. Suspect an underlying component of interstitial fibrosis. Electronically Signed   By: Alm Parkins M.D.   On: 07/12/2024 10:08     Procedures   Medications Ordered in the  ED  furosemide  (LASIX ) injection 40 mg (40 mg Intravenous Given 07/12/24 1209)                                    Medical Decision Making Amount and/or Complexity of Data Reviewed Labs: ordered. Radiology: ordered.  Risk Prescription drug management.   89 yo M with a cc of sob, worsening leg edema, R groin pain and hemoptysis.    On eliquis  less likely to be PE, will send off ddimer.  Blood work.    No significant edema on cxr on my independent interpretation.  BNP at baseline.  No aki, trop negative.   D-dimer is age-adjusted negative.  2 troponins are negative.  I discussed results with the patient and family.  Is doing a little bit better after bolus dose of Lasix .  Will increase Lasix  dose at home for the next few days.  PCP and cardiology follow-up.  UA was concerning for possible UTI.  Also started on antibiotics.  3:48 PM:  I have discussed the diagnosis/risks/treatment options with the patient and family.  Evaluation and diagnostic testing in the emergency department does not suggest an emergent condition requiring admission or immediate intervention beyond what has been performed at this time.  They will follow up with PCP. We also discussed returning to the ED immediately if new or worsening sx occur. We discussed the sx which are most concerning (e.g., sudden worsening pain, fever, inability to tolerate by mouth) that necessitate immediate return. Medications administered to the patient during their visit and any new prescriptions provided to the patient are listed below.  Medications given during this visit Medications  furosemide  (LASIX ) injection 40 mg (40 mg Intravenous Given 07/12/24 1209)     The patient appears reasonably screen and/or stabilized  for discharge and I doubt any other medical condition or other [facility] requiring further screening, evaluation, or treatment in the ED at this time prior to discharge.       Final diagnoses:  Shortness of breath  Acute  cystitis without hematuria    ED Discharge Orders          Ordered    cefpodoxime  (VANTIN ) 100 MG tablet  2 times daily,   Status:  Discontinued        07/12/24 1239    cefpodoxime  (VANTIN ) 100 MG tablet  2 times daily        07/12/24 1240    furosemide  (LASIX ) 40 MG tablet  Daily PRN        07/12/24 1242               Abid Bolla, DO 07/12/24 1548

## 2024-07-12 NOTE — Discharge Instructions (Addendum)
 Try to double the lasix  for a few days.  Follow up with the family doc and cardiologist in the office. Please return for worsening difficulty breathing.

## 2024-07-12 NOTE — ED Notes (Signed)
 X-ray at bedside.

## 2024-07-12 NOTE — ED Triage Notes (Signed)
 Pt coming from home c/o [facility] for 3 months, chronically on 3L of O2. Coughing up blood for 3 days, weeping legs, bilateral leg edema. Last fluid pill was 2 days ago. No urinary issues. Axox4.

## 2024-07-14 LAB — URINE CULTURE: Culture: >=100000 — AB

## 2024-07-15 ENCOUNTER — Telehealth (HOSPITAL_BASED_OUTPATIENT_CLINIC_OR_DEPARTMENT_OTHER): Payer: Self-pay

## 2024-07-15 NOTE — Progress Notes (Addendum)
 ED Antimicrobial Stewardship Positive Culture Follow Up   Anthony Fields is an 89 y.o. male who presented to [facility] on 07/12/2024 with a chief complaint of No chief complaint on file.   Recent Results (from the past 720 hours)  Blood Culture (routine x 2)     Status: None (Preliminary result)   Collection Time: 07/12/24  9:34 AM   Specimen: BLOOD  Result Value Ref Range Status   Specimen Description BLOOD RIGHT ANTECUBITAL  Final   Special Requests   Final    BOTTLES DRAWN AEROBIC AND ANAEROBIC Blood Culture results may not be optimal due to an inadequate volume of blood received in culture bottles   Culture   Final    NO GROWTH 3 DAYS Performed at [facility], [address] [place], [place] [address]    Report Status PENDING  Incomplete  Urine Culture     Status: Abnormal   Collection Time: 07/12/24  9:34 AM   Specimen: Urine, Random  Result Value Ref Range Status   Specimen Description URINE, RANDOM  Final   Special Requests   Final    NONE Reflexed from [id number] Performed at [facility], [address], [place], [place] [address]    Culture >=100,000 COLONIES/mL PSEUDOMONAS AERUGINOSA (A)  Final   Report Status 07/14/2024 FINAL  Final   Organism ID, Bacteria PSEUDOMONAS AERUGINOSA (A)  Final      Susceptibility   Pseudomonas aeruginosa - MIC*    MEROPENEM 1 SENSITIVE Sensitive     CIPROFLOXACIN 0.12 SENSITIVE Sensitive     IMIPENEM 2 SENSITIVE Sensitive     PIP/TAZO Value in next row Sensitive ug/mL     8 SENSITIVEThis is a modified FDA-approved test that has been validated and its performance characteristics determined by the reporting laboratory.  This laboratory is certified under the Clinical Laboratory Improvement Amendments CLIA as qualified to perform high complexity clinical laboratory testing.    CEFEPIME  Value in next row Sensitive      8 SENSITIVEThis is a modified FDA-approved test that has been validated and its performance characteristics  determined by the reporting laboratory.  This laboratory is certified under the Clinical Laboratory Improvement Amendments CLIA as qualified to perform high complexity clinical laboratory testing.    CEFTAZIDIME/AVIBACTAM Value in next row Sensitive ug/mL     8 SENSITIVEThis is a modified FDA-approved test that has been validated and its performance characteristics determined by the reporting laboratory.  This laboratory is certified under the Clinical Laboratory Improvement Amendments CLIA as qualified to perform high complexity clinical laboratory testing.    CEFTOLOZANE/TAZOBACTAM Value in next row Sensitive ug/mL     8 SENSITIVEThis is a modified FDA-approved test that has been validated and its performance characteristics determined by the reporting laboratory.  This laboratory is certified under the Clinical Laboratory Improvement Amendments CLIA as qualified to perform high complexity clinical laboratory testing.    TOBRAMYCIN Value in next row Sensitive      8 SENSITIVEThis is a modified FDA-approved test that has been validated and its performance characteristics determined by the reporting laboratory.  This laboratory is certified under the Clinical Laboratory Improvement Amendments CLIA as qualified to perform high complexity clinical laboratory testing.    CEFTAZIDIME Value in next row Sensitive      8 SENSITIVEThis is a modified FDA-approved test that has been validated and its performance characteristics determined by the reporting laboratory.  This laboratory is certified under the Clinical Laboratory Improvement Amendments  CLIA as qualified to perform high complexity clinical laboratory testing.    * >=100,000 COLONIES/mL PSEUDOMONAS AERUGINOSA  Blood Culture (routine x 2)     Status: None (Preliminary result)   Collection Time: 07/12/24 10:33 AM   Specimen: BLOOD  Result Value Ref Range Status   Specimen Description BLOOD LEFT ANTECUBITAL  Final   Special Requests   Final    BOTTLES  DRAWN AEROBIC AND ANAEROBIC Blood Culture results may not be optimal due to an inadequate volume of blood received in culture bottles   Culture   Final    NO GROWTH 3 DAYS Performed at [facility], [address], [place], [place] [address]    Report Status PENDING  Incomplete    [x]  Treated with cefpodoxime , organism resistant to prescribed antimicrobial  ED Provider: Larraine Christen, PA-C  Plan:  - Stop cefpodoxime  - Call to assess clinical status (mental status at baseline, not altered, not weak), if at baseline and improved, no treatment warranted at this time - Return to ED if symptoms are present or have worsened   Feliciano Close, PharmD PGY2 Infectious Diseases Pharmacy Resident  07/15/2024 9:18 AM

## 2024-07-15 NOTE — Telephone Encounter (Signed)
 Post ED Visit - Positive Culture Follow-up: Unsuccessful Patient Follow-up  Culture assessed and recommendations reviewed by:  []  Rankin Dee, Pharm.D. []  Venetia Gully, Pharm.D., BCPS AQ-ID []  Garrel Crews, Pharm.D., BCPS []  Almarie Lunger, [address].D., BCPS []  [place], [address].D., BCPS, AAHIVP []  Rosaline Bihari, Pharm.D., BCPS, AAHIVP []  Massie Rigg, PharmD []  Jodie Rower, PharmD, BCPS  Positive urine culture  []  Patient discharged without antimicrobial prescription and treatment is now indicated [x]  Organism is resistant to prescribed ED discharge antimicrobial []  Patient with positive blood cultures  Plan - stop Cefpodoxime , if having s/s altered mental status, weakness, etc return for tx, no s/s no treatment needed.  Reviewed by ED provider - Larraine Christen, PA-C  Unable to contact patient after 3 attempts, letter will be sent to address on file  Ruth Camelia Elbe 07/15/2024, 10:17 AM

## 2024-07-17 LAB — CULTURE, BLOOD (ROUTINE X 2)
Culture: NO GROWTH
Culture: NO GROWTH

## 2024-07-20 ENCOUNTER — Ambulatory Visit (HOSPITAL_COMMUNITY): Attending: Physician Assistant | Admitting: Physician Assistant

## 2024-09-05 ENCOUNTER — Emergency Department (HOSPITAL_COMMUNITY)
Admission: EM | Admit: 2024-09-05 | Discharge: 2024-09-05 | Disposition: A | Discharge status: Home / Self Care | Attending: Emergency Medicine | Admitting: Emergency Medicine

## 2024-09-05 ENCOUNTER — Encounter (HOSPITAL_COMMUNITY): Payer: Self-pay

## 2024-09-05 ENCOUNTER — Other Ambulatory Visit: Payer: Self-pay

## 2024-09-05 DIAGNOSIS — Z7901 Long term (current) use of anticoagulants: Secondary | ICD-10-CM | POA: Insufficient documentation

## 2024-09-05 DIAGNOSIS — N3001 Acute cystitis with hematuria: Secondary | ICD-10-CM | POA: Insufficient documentation

## 2024-09-05 DIAGNOSIS — R310 Gross hematuria: Secondary | ICD-10-CM

## 2024-09-05 DIAGNOSIS — R319 Hematuria, unspecified: Secondary | ICD-10-CM | POA: Diagnosis present

## 2024-09-05 LAB — CBC WITH DIFFERENTIAL/PLATELET
Abs Immature Granulocytes: 0.02 K/uL (ref 0.00–0.07)
Basophils Absolute: 0.1 K/uL (ref 0.0–0.1)
Basophils Relative: 1 %
Eosinophils Absolute: 0.4 K/uL (ref 0.0–0.5)
Eosinophils Relative: 6 %
HCT: 34.7 % — ABNORMAL LOW (ref 39.0–52.0)
Hemoglobin: 11 g/dL — ABNORMAL LOW (ref 13.0–17.0)
Immature Granulocytes: 0 %
Lymphocytes Relative: 20 %
Lymphs Abs: 1.3 K/uL (ref 0.7–4.0)
MCH: 30.2 pg (ref 26.0–34.0)
MCHC: 31.7 g/dL (ref 30.0–36.0)
MCV: 95.3 fL (ref 80.0–100.0)
Monocytes Absolute: 0.5 K/uL (ref 0.1–1.0)
Monocytes Relative: 7 %
Neutro Abs: 4.4 K/uL (ref 1.7–7.7)
Neutrophils Relative %: 66 %
Platelets: 200 K/uL (ref 150–400)
RBC: 3.64 MIL/uL — ABNORMAL LOW (ref 4.22–5.81)
RDW: 15.8 % — ABNORMAL HIGH (ref 11.5–15.5)
WBC: 6.7 K/uL (ref 4.0–10.5)
nRBC: 0 % (ref 0.0–0.2)

## 2024-09-05 LAB — URINALYSIS, W/ REFLEX TO CULTURE (INFECTION SUSPECTED)
Bilirubin Urine: NEGATIVE
Glucose, UA: NEGATIVE mg/dL
Ketones, ur: NEGATIVE mg/dL
Nitrite: NEGATIVE
Protein, ur: 100 mg/dL — AB
RBC / HPF: >50 RBC/hpf (ref 0–5)
Specific Gravity, Urine: 1.008 (ref 1.005–1.030)
WBC, UA: >50 WBC/hpf (ref 0–5)
pH: 7 (ref 5.0–8.0)

## 2024-09-05 LAB — COMPREHENSIVE METABOLIC PANEL WITH GFR
ALT: 29 U/L (ref 0–44)
AST: 35 U/L (ref 15–41)
Albumin: 2.3 g/dL — ABNORMAL LOW (ref 3.5–5.0)
Alkaline Phosphatase: 45 U/L (ref 38–126)
Anion gap: 7 (ref 5–15)
BUN: 10 mg/dL (ref 8–23)
CO2: 25 mmol/L (ref 22–32)
Calcium: 8.6 mg/dL — ABNORMAL LOW (ref 8.9–10.3)
Chloride: 102 mmol/L (ref 98–111)
Creatinine, Ser: 0.91 mg/dL (ref 0.61–1.24)
GFR, Estimated: >60 mL/min (ref >60)
Glucose, Bld: 102 mg/dL — ABNORMAL HIGH (ref 70–99)
Potassium: 3.9 mmol/L (ref 3.5–5.1)
Sodium: 134 mmol/L — ABNORMAL LOW (ref 135–145)
Total Bilirubin: 0.9 mg/dL (ref 0.0–1.2)
Total Protein: 5.5 g/dL — ABNORMAL LOW (ref 6.5–8.1)

## 2024-09-05 MED ORDER — SODIUM CHLORIDE 0.9 % IV SOLN
1.0000 g | Freq: Once | INTRAVENOUS | Status: AC
  Administered 2024-09-05: 1 g via INTRAVENOUS
  Filled 2024-09-05: qty 10

## 2024-09-05 MED ORDER — CEPHALEXIN 500 MG PO CAPS: 500.0000 mg | ORAL_CAPSULE | Freq: Four times a day (QID) | ORAL | 0 refills | Status: AC

## 2024-09-05 NOTE — ED Triage Notes (Signed)
 Pt has a chronic foley catheter, pt has had it exchanged once a week for the past month due to sediment and blockages. Pt had his catheter changed around 7pm last night and since then he's had hematuria and bleeding/clots around the catheter. Pt is on a blood thinner.

## 2024-09-05 NOTE — Discharge Instructions (Addendum)
 Your history, exam, and workup today are consistent with bleeding around the Foley catheter that was exchanged yesterday.  Due to the blood thinner use I suspect that is why keeps oozing and clotting however it seems to be functioning well and we agreed together to not exchange it again today.  The urine does show evidence of infection so please take the antibiotics for the next 2 weeks to treat.  We gave him a dose of IV antibiotics to date to get a jumpstart on that.  The rest of the labs were reassuring and he was not critically anemic compared to prior.  We feel he is safe for discharge home but please follow-up with [facility] soon as possible.  Please rest and stay hydrated and follow-up with his PCP as well.  If any symptoms change or worsen acutely including mental status changes, please return to the nearest Emergency Department.

## 2024-09-05 NOTE — ED Provider Triage Note (Signed)
 Emergency Medicine Provider Triage Evaluation Note  EFFREY DAVIDOW , a 89 y.o. male  was evaluated in triage.  Pt complains of hematuria. Reports that he had his foley exchanged yesterday and has been having hematuria since then. Reports very large jelly-like clots both in the foley bag and coming out around the catheter. Patient denies any pain, fevers, or chills. On home O2 unchanged from baseline. Follows with urology in [place]  Review of Systems  Positive:  Negative:   Physical Exam  BP 110/74 (BP Location: Left Arm)   Pulse 96   Temp (!) 97.5 F (36.4 C)   Resp 17   Ht 5' 8 (1.727 m)   Wt 74.8 kg   SpO2 100%   BMI 25.09 kg/m  Gen:   Awake, no distress   Resp:  Normal effort  MSK:   Moves extremities without difficulty  Other:    Medical Decision Making  Medically screening exam initiated at 12:58 PM.  Appropriate orders placed.  KYL GIVLER was informed that the remainder of the evaluation will be completed by another provider, this initial triage assessment does not replace that evaluation, and the importance of remaining in the ED until their evaluation is complete.     Nora Lauraine LABOR, PA-C 09/05/24 1300

## 2024-09-05 NOTE — ED Provider Notes (Signed)
 [place] EMERGENC[facility] DEPARTMENT AT [facility] Provider Note   CSN: [id number] Arrival date & time: 09/05/24  1223     Patient presents with: Hematuria   Anthony Fields is a 89 y.o. male.   The history is provided by the patient, medical records and a relative. No language interpreter was used.  Hematuria This is a recurrent problem. The current episode started yesterday. The problem occurs constantly. The problem has not changed since onset.Pertinent negatives include no chest pain, no abdominal pain, no headaches and no shortness of breath. Nothing aggravates the symptoms. Nothing relieves the symptoms. He has tried nothing for the symptoms. The treatment provided no relief.       Prior to Admission medications   Medication Sig Start Date End Date Taking? Authorizing Provider  amiodarone  (PACERONE ) 200 MG tablet Take 1 tablet (200 mg total) by mouth 2 (two) times daily for 7 days, THEN 1 tablet (200 mg total) daily. 06/01/24 07/08/24  Singh, Prashant K, MD  amoxicillin -clavulanate (AUGMENTIN ) 875-125 MG tablet Take 1 tablet by mouth every 12 (twelve) hours. 06/01/24   Singh, Prashant K, MD  Ascorbic Acid (VITAMIN C) 1000 MG tablet Take 1,000 mg by mouth daily. 07/01/24   [provider]  atorvastatin  (LIPITOR) 40 MG tablet Take 40 mg by mouth daily.    [provider]  cefpodoxime  (VANTIN ) 100 MG tablet Take 1 tablet (100 mg total) by mouth 2 (two) times daily. 07/12/24   Emil Share, DO  cholecalciferol (VITAMIN D3) 25 MCG (1000 UNIT) tablet Take 1,000 Units by mouth daily.    [provider]  ELIQUIS  5 MG TABS tablet Take 5 mg by mouth 2 (two) times daily.    [provider]  fenofibrate  (TRICOR ) 145 MG tablet Take 145 mg by mouth daily.    [provider]  furosemide  (LASIX ) 40 MG tablet Take 2 tablets (80 mg total) by mouth daily as needed for fluid or edema (swelling, sob). 07/12/24   Emil Share, DO  ipratropium-albuterol  (DUONEB)  0.5-2.5 (3) MG/3ML SOLN Use one vial via nebulization twice a day scheduled and every 4 hours as needed for shortness of breath and wheezing 06/01/24   Singh, Prashant K, MD  metoprolol  succinate (TOPROL -XL) 25 MG 24 hr tablet Take 25 mg by mouth daily.    [provider]  midodrine  (PROAMATINE ) 5 MG tablet Take 1 tablet (5 mg total) by mouth 2 (two) times daily with a meal. 06/01/24   Singh, Prashant K, MD  niacin (NIASPAN) 1000 MG CR tablet Take 1,000 mg by mouth at bedtime.    [provider]  oxybutynin  (DITROPAN ) 5 MG tablet Take 5 mg by mouth 2 (two) times daily. 05/03/24   [provider]  pantoprazole  (PROTONIX ) 40 MG tablet Take 40 mg by mouth daily.    [provider]  polyethylene glycol powder (GLYCOLAX /MIRALAX ) 17 GM/SCOOP powder Take 17 g by mouth daily as needed. 06/01/24   Singh, Prashant K, MD  tamsulosin  (FLOMAX ) 0.4 MG CAPS capsule Take 0.4 mg by mouth daily after supper.    [provider]  VITAMIN C/NATURAL ROSE HIPS 1000 MG tablet Take 1,000 mg by mouth daily. 07/01/24   [provider]    Allergies: Patient has no known allergies.    Review of Systems  Constitutional:  Negative for chills, fatigue and fever.  HENT:  Negative for congestion.   Respiratory:  Negative for chest tightness and shortness of breath.   Cardiovascular:  Negative  for chest pain.  Gastrointestinal:  Negative for abdominal pain, constipation, diarrhea, nausea and vomiting.  Genitourinary:  Positive for hematuria. Negative for decreased urine volume, dysuria, flank pain, penile pain, penile swelling, scrotal swelling and testicular pain.  Musculoskeletal:  Negative for back pain, neck pain and neck stiffness.  Skin:  Negative for rash and wound.  Neurological:  Negative for light-headedness and headaches.  Psychiatric/Behavioral:  Negative for agitation.   All other systems reviewed and are negative.   Updated Vital Signs BP 110/74 (BP Location: Left  Arm)   Pulse 96   Temp (!) 97.5 F (36.4 C)   Resp 17   Ht 5' 8 (1.727 m)   Wt 74.8 kg   SpO2 100%   BMI 25.09 kg/m   Physical Exam Vitals and nursing note reviewed. Exam conducted with a chaperone present.  Constitutional:      General: He is not in acute distress.    Appearance: He is well-developed.  HENT:     Head: Normocephalic and atraumatic.     Nose: No congestion or rhinorrhea.     Mouth/Throat:     Mouth: Mucous membranes are moist.     Pharynx: No oropharyngeal exudate or posterior oropharyngeal erythema.  Eyes:     Conjunctiva/sclera: Conjunctivae normal.  Cardiovascular:     Rate and Rhythm: Normal rate and regular rhythm.     Heart sounds: No murmur heard. Pulmonary:     Effort: Pulmonary effort is normal. No respiratory distress.     Breath sounds: Normal breath sounds. No wheezing, rhonchi or rales.  Chest:     Chest wall: No tenderness.  Abdominal:     Palpations: Abdomen is soft.     Tenderness: There is no abdominal tenderness.  Genitourinary:    Comments: Foley catheter in place with some gross hematuria, rounded but urine coming through the lumen of the Foley.  Musculoskeletal:        General: No swelling or tenderness.     Cervical back: Neck supple.  Skin:    General: Skin is warm and dry.     Capillary Refill: Capillary refill takes less than 2 seconds.     Findings: No erythema or rash.  Neurological:     Mental Status: He is alert.  Psychiatric:        Mood and Affect: Mood normal.     (all labs ordered are listed, but only abnormal results are displayed) Labs Reviewed  CBC WITH DIFFERENTIAL/PLATELET - Abnormal; Notable for the following components:      Result Value   RBC 3.64 (*)    Hemoglobin 11.0 (*)    HCT 34.7 (*)    RDW 15.8 (*)    All other components within normal limits  COMPREHENSIVE METABOLIC PANEL WITH GFR - Abnormal; Notable for the following components:   Sodium 134 (*)    Glucose, Bld 102 (*)    Calcium  8.6 (*)     Total Protein 5.5 (*)    Albumin  2.3 (*)    All other components within normal limits  URINALYSIS, W/ REFLEX TO CULTURE (INFECTION SUSPECTED) - Abnormal; Notable for the following components:   Color, Urine AMBER (*)    APPearance CLOUDY (*)    Hgb urine dipstick MODERATE (*)    Protein, ur 100 (*)    Leukocytes,Ua SMALL (*)    Bacteria, UA MANY (*)    All other components within normal limits  URINE CULTURE    EKG: None  Radiology: No  results found.   Procedures   Medications Ordered in the ED  cefTRIAXone  (ROCEPHIN ) 1 g in sodium chloride  0.9 % 100 mL IVPB (1 g Intravenous New Bag/Given 09/05/24 1524)                                    Medical Decision Making Amount and/or Complexity of Data Reviewed Labs: ordered.  Risk Prescription drug management.    Anthony Fields is a 89 y.o. 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  According to patient and family, patient has had numerous exchanges of the Foley catheter due to sediment and it getting clogged.  It was exchanged yesterday and since then he has had some gross hematuria around the leaking line of the Foley as well as some blood inside the Foley.  He has no abdominal pain or distention and does not feel that there is a problem with the Foley catheter itself and it is draining well.  He denies dysuria otherwise.  Reports it is cloudy and foggy and is concerned about infection.  No fevers, chills, congestion, cough, nausea, vomiting constipation, or diarrhea.  No other complaints reported.  On exam with chaperone, patient was assessed.  He did not have any abdominal tenderness whatsoever active bowel sounds.  His Foley catheter appeared to be in place with some gross hematuria coming around the sides of it.  Family does report that it recently had it changed  and size of the catheter where it was larger and smaller than large and smaller.  No other tenderness on exam.  Patient otherwise well-appearing.  Had a shared Szymon conversation with patient and family and we agreed to not to change the catheter given the normal flow and lack of evidence of retention with no abdominal pain or tenderness.  Urinalysis was sent due to the sediment does appear to show urinary tract infection a culture was sent and he will be treated with antibiotics both a dose of IV now and oral antibiotics at discharge.  His labs showed similar anemia to prior and no evidence of AKI.  He had a shared Szymon conversation offering admission for the UTI however he would rather go home with antibiotics and follow-up with the new urology team.  They were given instructions to follow-up with wife urology.  They understand that it would likely continue to ooze around the catheter site given the likely urethral injury during placement but as it was growing now, we do not want to exchange it and they agree.  They had no other questions or concerns and was discharged in good condition and return precautions for any mental status changes or other problems.      Final diagnoses:  Gross hematuria  Acute cystitis with hematuria    ED Discharge Orders          Ordered    cephALEXin (KEFLEX) 500 MG capsule  4 times daily        09/05/24 1506    Ambulatory referral to Urology        09/05/24 1507            Clinical Impression: 1. Gross hematuria   2. Acute cystitis with hematuria     Disposition: Discharge  Condition: Good  I have discussed the results, Dx and  Tx plan with the pt(& family if present). He/she/they expressed understanding and agree(s) with the plan. Discharge instructions discussed at great length. Strict return precautions discussed and pt &/or family have verbalized understanding of the instructions. No further questions at time of discharge.    New  Prescriptions   CEPHALEXIN (KEFLEX) 500 MG CAPSULE    Take 1 capsule (500 mg total) by mouth 4 (four) times daily for 14 days.    Follow Up: [facility] [address] [place] [address] [place]  [address] [phone]        Jadden Yim, Lonni PARAS, MD 09/05/24 [phone]

## 2024-09-08 LAB — URINE CULTURE: Culture: >=100000 — AB

## 2024-09-09 NOTE — Progress Notes (Signed)
 ED Antimicrobial Stewardship Positive Culture Follow Up   Anthony Fields is an 89 y.o. male who presented to [facility] with a chief complaint of  Chief Complaint  Patient presents with   Hematuria    Recent Results (from the past 720 hours)  Urine Culture     Status: Abnormal   Collection Time: 09/05/24  1:41 PM   Specimen: Urine, Clean Catch  Result Value Ref Range Status   Specimen Description URINE, CLEAN CATCH  Final   Special Requests   Final    NONE Performed at [facility], [address], [place], [place] [address]    Culture (A)  Final    >=100,000 COLONIES/mL PSEUDOMONAS AERUGINOSA 60,000 COLONIES/mL PROTEUS MIRABILIS    Report Status 09/08/2024 FINAL  Final   Organism ID, Bacteria PSEUDOMONAS AERUGINOSA (A)  Final   Organism ID, Bacteria PROTEUS MIRABILIS (A)  Final      Susceptibility   Pseudomonas aeruginosa - MIC*    MEROPENEM 1 SENSITIVE Sensitive     CIPROFLOXACIN 0.12 SENSITIVE Sensitive     IMIPENEM <=0.5 SENSITIVE Sensitive     PIP/TAZO Value in next row Sensitive      <=4 SENSITIVEThis is a modified FDA-approved test that has been validated and its performance characteristics determined by the reporting laboratory.  This laboratory is certified under the Clinical Laboratory Improvement Amendments CLIA as qualified to perform high complexity clinical laboratory testing.    CEFEPIME  Value in next row Sensitive      <=4 SENSITIVEThis is a modified FDA-approved test that has been validated and its performance characteristics determined by the reporting laboratory.  This laboratory is certified under the Clinical Laboratory Improvement Amendments CLIA as qualified to perform high complexity clinical laboratory testing.    CEFTAZIDIME/AVIBACTAM Value in next row Sensitive      <=4 SENSITIVEThis is a modified FDA-approved test that has been validated and its performance characteristics determined by the reporting laboratory.  This laboratory is certified under  the Clinical Laboratory Improvement Amendments CLIA as qualified to perform high complexity clinical laboratory testing.    CEFTOLOZANE/TAZOBACTAM Value in next row Sensitive      <=4 SENSITIVEThis is a modified FDA-approved test that has been validated and its performance characteristics determined by the reporting laboratory.  This laboratory is certified under the Clinical Laboratory Improvement Amendments CLIA as qualified to perform high complexity clinical laboratory testing.    TOBRAMYCIN Value in next row Sensitive      <=4 SENSITIVEThis is a modified FDA-approved test that has been validated and its performance characteristics determined by the reporting laboratory.  This laboratory is certified under the Clinical Laboratory Improvement Amendments CLIA as qualified to perform high complexity clinical laboratory testing.    CEFTAZIDIME Value in next row Sensitive      <=4 SENSITIVEThis is a modified FDA-approved test that has been validated and its performance characteristics determined by the reporting laboratory.  This laboratory is certified under the Clinical Laboratory Improvement Amendments CLIA as qualified to perform high complexity clinical laboratory testing.    * >=100,000 COLONIES/mL PSEUDOMONAS AERUGINOSA   Proteus mirabilis - MIC*    AMPICILLIN Value in next row Sensitive      <=4 SENSITIVEThis is a modified FDA-approved test that has been validated and its performance characteristics determined by the reporting laboratory.  This laboratory is certified under the Clinical Laboratory Improvement Amendments CLIA as qualified to perform high complexity clinical laboratory testing.    CEFAZOLIN  (URINE) Value in next  row Sensitive      4 SENSITIVEThis is a modified FDA-approved test that has been validated and its performance characteristics determined by the reporting laboratory.  This laboratory is certified under the Clinical Laboratory Improvement Amendments CLIA as qualified to  perform high complexity clinical laboratory testing.    CEFEPIME  Value in next row Sensitive      4 SENSITIVEThis is a modified FDA-approved test that has been validated and its performance characteristics determined by the reporting laboratory.  This laboratory is certified under the Clinical Laboratory Improvement Amendments CLIA as qualified to perform high complexity clinical laboratory testing.    ERTAPENEM Value in next row Sensitive      4 SENSITIVEThis is a modified FDA-approved test that has been validated and its performance characteristics determined by the reporting laboratory.  This laboratory is certified under the Clinical Laboratory Improvement Amendments CLIA as qualified to perform high complexity clinical laboratory testing.    CEFTRIAXONE  Value in next row Sensitive      4 SENSITIVEThis is a modified FDA-approved test that has been validated and its performance characteristics determined by the reporting laboratory.  This laboratory is certified under the Clinical Laboratory Improvement Amendments CLIA as qualified to perform high complexity clinical laboratory testing.    CIPROFLOXACIN Value in next row Resistant      4 SENSITIVEThis is a modified FDA-approved test that has been validated and its performance characteristics determined by the reporting laboratory.  This laboratory is certified under the Clinical Laboratory Improvement Amendments CLIA as qualified to perform high complexity clinical laboratory testing.    GENTAMICIN Value in next row Sensitive      4 SENSITIVEThis is a modified FDA-approved test that has been validated and its performance characteristics determined by the reporting laboratory.  This laboratory is certified under the Clinical Laboratory Improvement Amendments CLIA as qualified to perform high complexity clinical laboratory testing.    NITROFURANTOIN Value in next row Resistant      4 SENSITIVEThis is a modified FDA-approved test that has been validated  and its performance characteristics determined by the reporting laboratory.  This laboratory is certified under the Clinical Laboratory Improvement Amendments CLIA as qualified to perform high complexity clinical laboratory testing.    TRIMETH/SULFA Value in next row Sensitive      4 SENSITIVEThis is a modified FDA-approved test that has been validated and its performance characteristics determined by the reporting laboratory.  This laboratory is certified under the Clinical Laboratory Improvement Amendments CLIA as qualified to perform high complexity clinical laboratory testing.    AMPICILLIN/SULBACTAM Value in next row Sensitive      4 SENSITIVEThis is a modified FDA-approved test that has been validated and its performance characteristics determined by the reporting laboratory.  This laboratory is certified under the Clinical Laboratory Improvement Amendments CLIA as qualified to perform high complexity clinical laboratory testing.    PIP/TAZO Value in next row Sensitive      <=4 SENSITIVEThis is a modified FDA-approved test that has been validated and its performance characteristics determined by the reporting laboratory.  This laboratory is certified under the Clinical Laboratory Improvement Amendments CLIA as qualified to perform high complexity clinical laboratory testing.    MEROPENEM Value in next row Sensitive      <=4 SENSITIVEThis is a modified FDA-approved test that has been validated and its performance characteristics determined by the reporting laboratory.  This laboratory is certified under the Clinical Laboratory Improvement Amendments CLIA as qualified to perform high complexity clinical laboratory testing.    *  60,000 COLONIES/mL PROTEUS MIRABILIS    Symptom check -   If symptoms - cipro 500mg  BID x 7 days.  No symptoms - no changes.   New antibiotic prescription: Cipro 500mg  BID x 7 days.   ED Provider: Dr. Patsey Powell Blush, PharmD, BCCCP  09/09/2024, 9:25  AM Clinical Pharmacist Monday - Friday phone -  [phone] Saturday - Sunday phone - [phone]

## 2024-09-10 ENCOUNTER — Telehealth (HOSPITAL_BASED_OUTPATIENT_CLINIC_OR_DEPARTMENT_OTHER): Payer: Self-pay | Admitting: *Deleted

## 2024-09-10 NOTE — Telephone Encounter (Signed)
 Post ED Visit - Positive Culture Follow-up  Culture report reviewed by antimicrobial stewardship pharmacist: [facility] [x]  Powell Blush, Pharm.D. []  Venetia Gully, Pharm.D., BCPS AQ-ID []  Garrel Crews, Pharm.D., BCPS []  Almarie Lunger, Pharm.D., BCPS []  [place], [address].D., BCPS, AAHIVP []  Rosaline Bihari, Pharm.D., BCPS, AAHIVP []  Vernell Meier, PharmD, BCPS []  Latanya Hint, PharmD, BCPS []  Donald Medley, PharmD, BCPS []  Rocky Bold, PharmD []  Dorothyann Alert, PharmD, BCPS []  Morene Babe, PharmD  [facility] []  Rosaline Edison, PharmD []  Romona Bliss, PharmD []  Dolphus Roller, PharmD []  Veva Seip, Rph []  Vernell Daunt) Leonce, PharmD []  Eva Allis, PharmD []  Rosaline Millet, PharmD []  Iantha Batch, PharmD []  Arvin Gauss, PharmD []  Wanda Hasting, PharmD []  Ronal Rav, PharmD []  Rocky Slade, PharmD []  Bard Jeans, PharmD   Positive urine culture reviewed by Dr LOISE River. Attempted to call patient for symptom check. If patient was having urinary symptoms  Cipro 500 mg BID for 7 days. Treated with cephalexin, organism sensitive to the same.  Jama Lisle Blondie 09/10/2024, 9:03 AM

## 2024-12-07 ENCOUNTER — Emergency Department (HOSPITAL_COMMUNITY)

## 2024-12-07 ENCOUNTER — Other Ambulatory Visit: Payer: Self-pay

## 2024-12-07 ENCOUNTER — Encounter (HOSPITAL_COMMUNITY): Payer: Self-pay

## 2024-12-07 ENCOUNTER — Emergency Department (HOSPITAL_COMMUNITY)
Admission: EM | Admit: 2024-12-07 | Discharge: 2024-12-07 | Disposition: A | Discharge status: Home / Self Care | Attending: Emergency Medicine | Admitting: Emergency Medicine

## 2024-12-07 DIAGNOSIS — R600 Localized edema: Secondary | ICD-10-CM | POA: Insufficient documentation

## 2024-12-07 DIAGNOSIS — Z955 Presence of coronary angioplasty implant and graft: Secondary | ICD-10-CM | POA: Insufficient documentation

## 2024-12-07 DIAGNOSIS — Z79899 Other long term (current) drug therapy: Secondary | ICD-10-CM | POA: Insufficient documentation

## 2024-12-07 DIAGNOSIS — I10 Essential (primary) hypertension: Secondary | ICD-10-CM | POA: Diagnosis not present

## 2024-12-07 DIAGNOSIS — Z7901 Long term (current) use of anticoagulants: Secondary | ICD-10-CM | POA: Insufficient documentation

## 2024-12-07 DIAGNOSIS — U071 COVID-19: Secondary | ICD-10-CM | POA: Insufficient documentation

## 2024-12-07 DIAGNOSIS — N3001 Acute cystitis with hematuria: Secondary | ICD-10-CM | POA: Insufficient documentation

## 2024-12-07 DIAGNOSIS — I482 Chronic atrial fibrillation, unspecified: Secondary | ICD-10-CM | POA: Insufficient documentation

## 2024-12-07 DIAGNOSIS — R059 Cough, unspecified: Secondary | ICD-10-CM | POA: Diagnosis present

## 2024-12-07 LAB — COMPREHENSIVE METABOLIC PANEL WITH GFR
ALT: 25 U/L (ref 0–44)
AST: 33 U/L (ref 15–41)
Albumin: 3.8 g/dL (ref 3.5–5.0)
Alkaline Phosphatase: 69 U/L (ref 38–126)
Anion gap: 13 (ref 5–15)
BUN: 8 mg/dL (ref 8–23)
CO2: 24 mmol/L (ref 22–32)
Calcium: 8.9 mg/dL (ref 8.9–10.3)
Chloride: 98 mmol/L (ref 98–111)
Creatinine, Ser: 0.75 mg/dL (ref 0.61–1.24)
GFR, Estimated: >60 mL/min
Glucose, Bld: 90 mg/dL (ref 70–99)
Potassium: 4.3 mmol/L (ref 3.5–5.1)
Sodium: 136 mmol/L (ref 135–145)
Total Bilirubin: 0.9 mg/dL (ref 0.0–1.2)
Total Protein: 7.3 g/dL (ref 6.5–8.1)

## 2024-12-07 LAB — URINALYSIS, W/ REFLEX TO CULTURE (INFECTION SUSPECTED)
Bilirubin Urine: NEGATIVE
Glucose, UA: NEGATIVE mg/dL
Ketones, ur: NEGATIVE mg/dL
Nitrite: NEGATIVE
Protein, ur: 100 mg/dL — AB
RBC / HPF: >50 RBC/hpf (ref 0–5)
Specific Gravity, Urine: 1.01 (ref 1.005–1.030)
WBC, UA: >50 WBC/hpf (ref 0–5)
pH: 6 (ref 5.0–8.0)

## 2024-12-07 LAB — RESP PANEL BY RT-PCR (RSV, FLU A&B, COVID)  RVPGX2
Influenza A by PCR: NEGATIVE
Influenza B by PCR: NEGATIVE
Resp Syncytial Virus by PCR: NEGATIVE
SARS Coronavirus 2 by RT PCR: POSITIVE — AB

## 2024-12-07 LAB — CBC WITH DIFFERENTIAL/PLATELET
Abs Immature Granulocytes: 0.05 K/uL (ref 0.00–0.07)
Basophils Absolute: 0.1 K/uL (ref 0.0–0.1)
Basophils Relative: 1 %
Eosinophils Absolute: 0.5 K/uL (ref 0.0–0.5)
Eosinophils Relative: 7 %
HCT: 42.3 % (ref 39.0–52.0)
Hemoglobin: 13.9 g/dL (ref 13.0–17.0)
Immature Granulocytes: 1 %
Lymphocytes Relative: 15 %
Lymphs Abs: 1.1 K/uL (ref 0.7–4.0)
MCH: 32.3 pg (ref 26.0–34.0)
MCHC: 32.9 g/dL (ref 30.0–36.0)
MCV: 98.1 fL (ref 80.0–100.0)
Monocytes Absolute: 0.5 K/uL (ref 0.1–1.0)
Monocytes Relative: 7 %
Neutro Abs: 5 K/uL (ref 1.7–7.7)
Neutrophils Relative %: 69 %
Platelets: 212 K/uL (ref 150–400)
RBC: 4.31 MIL/uL (ref 4.22–5.81)
RDW: 14.6 % (ref 11.5–15.5)
WBC: 7.3 K/uL (ref 4.0–10.5)
nRBC: 0 % (ref 0.0–0.2)

## 2024-12-07 LAB — LACTIC ACID, PLASMA: Lactic Acid, Venous: 1.7 mmol/L (ref 0.5–1.9)

## 2024-12-07 LAB — PRO BRAIN NATRIURETIC PEPTIDE: Pro Brain Natriuretic Peptide: 829 pg/mL — ABNORMAL HIGH

## 2024-12-07 MED ORDER — CIPROFLOXACIN HCL 500 MG PO TABS: 500.0000 mg | ORAL_TABLET | Freq: Two times a day (BID) | ORAL | 0 refills | Status: AC

## 2024-12-07 MED ORDER — CIPROFLOXACIN IN D5W 400 MG/200ML IV SOLN
400.0000 mg | Freq: Once | INTRAVENOUS | Status: AC
  Administered 2024-12-07: 400 mg via INTRAVENOUS
  Filled 2024-12-07: qty 200

## 2024-12-07 NOTE — ED Provider Notes (Signed)
 " [place] EMERGENCY DEPARTMENT AT [facility] Provider Note  CSN: [id number] Arrival date & time: 12/07/24 1613  Chief Complaint(s) Recurrent UTI  HPI Anthony Fields is a 89 y.o. male with atrial fibrillation on anticoagulation, hypertension, hyperlipidemia, recurrent UTI presenting to the emergency department with weakness.  Family report patient has been more confused, weak, staying in bed he frequently wears a condom cath baseline mobility and they have noticed his been darker foul-smelling.  Weakness and confusion consistent with prior episodes of urinary infection.  Has also had some cough and congestion past day.  Patient denies feeling short of breath or any pain or discomfort.  Has some chronic leg swelling which they report is unchanged.  No fevers.   Past Medical History Past Medical History:  Diagnosis Date   Acid reflux    Atrial fibrillation (HCC)    Hand deformity, congenital    right hand -fingers   High cholesterol    Hypertension    Patient Active Problem List   Diagnosis Date Noted   Sepsis (HCC) 06/01/2024   Acute systolic heart failure (HCC) 05/30/2024   Atrial fibrillation with rapid ventricular response (HCC) 05/29/2024   Acute respiratory failure with hypoxia (HCC) 05/25/2024   Pressure injury of skin 05/25/2024   Pneumonia 05/24/2024   Generalized abdominal pain 09/02/2023   Home Medication(s) Prior to Admission medications  Medication Sig Start Date End Date Taking? Authorizing Provider  ciprofloxacin  (CIPRO ) 500 MG tablet Take 1 tablet (500 mg total) by mouth every 12 (twelve) hours for 7 days. 12/07/24 12/14/24 Yes Francesca Elsie CROME, MD  amiodarone  (PACERONE ) 200 MG tablet Take 1 tablet (200 mg total) by mouth 2 (two) times daily for 7 days, THEN 1 tablet (200 mg total) daily. 06/01/24 07/08/24  Singh, Prashant K, MD  Ascorbic Acid (VITAMIN C) 1000 MG tablet Take 1,000 mg by mouth daily. 07/01/24   [provider]  atorvastatin  (LIPITOR) 40  MG tablet Take 40 mg by mouth daily.    [provider]  cholecalciferol (VITAMIN D3) 25 MCG (1000 UNIT) tablet Take 1,000 Units by mouth daily.    [provider]  ELIQUIS  5 MG TABS tablet Take 5 mg by mouth 2 (two) times daily.    [provider]  fenofibrate  (TRICOR ) 145 MG tablet Take 145 mg by mouth daily.    [provider]  furosemide  (LASIX ) 40 MG tablet Take 2 tablets (80 mg total) by mouth daily as needed for fluid or edema (swelling, sob). 07/12/24   Emil Share, DO  ipratropium-albuterol  (DUONEB) 0.5-2.5 (3) MG/3ML SOLN Use one vial via nebulization twice a day scheduled and every 4 hours as needed for shortness of breath and wheezing 06/01/24   Singh, Prashant K, MD  metoprolol  succinate (TOPROL -XL) 25 MG 24 hr tablet Take 25 mg by mouth daily.    [provider]  midodrine  (PROAMATINE ) 5 MG tablet Take 1 tablet (5 mg total) by mouth 2 (two) times daily with a meal. 06/01/24   Singh, Prashant K, MD  niacin (NIASPAN) 1000 MG CR tablet Take 1,000 mg by mouth at bedtime.    [provider]  oxybutynin  (DITROPAN ) 5 MG tablet Take 5 mg by mouth 2 (two) times daily. 05/03/24   [provider]  pantoprazole  (PROTONIX ) 40 MG tablet Take 40 mg by mouth daily.    [provider]  polyethylene glycol powder (GLYCOLAX /MIRALAX ) 17 GM/SCOOP powder Take 17 g by mouth daily as needed. 06/01/24   Dennise,  Prashant K, MD  tamsulosin  (FLOMAX ) 0.4 MG CAPS capsule Take 0.4 mg by mouth daily after supper.    [provider]  VITAMIN C/NATURAL ROSE HIPS 1000 MG tablet Take 1,000 mg by mouth daily. 07/01/24   [provider]                                                                                                                                    Past Surgical History Past Surgical History:  Procedure Laterality Date   CARDIAC CATHETERIZATION     CORONARY ANGIOPLASTY WITH STENT PLACEMENT     HIP ARTHROSCOPY Left    March  2025   I & D EXTREMITY  11/14/2012   Procedure: IRRIGATION AND DEBRIDEMENT EXTREMITY;  Surgeon: Balinda JAYSON Rogue, MD;  Location: [facility] OR;  Service: Plastics;  Laterality: Right;   Family History History reviewed. No pertinent family history.  Social History Social History[1] Allergies Patient has no known allergies.  Review of Systems Review of Systems  All other systems reviewed and are negative.   Physical Exam Vital Signs  I have reviewed the triage vital signs BP 133/68   Pulse (!) 59   Temp 98.9 F (37.2 C) (Oral)   Resp (!) 21   Ht 5' 8 (1.727 m)   Wt 74.8 kg   SpO2 99%   BMI 25.07 kg/m  Physical Exam Vitals and nursing note reviewed.  Constitutional:      General: He is not in acute distress.    Appearance: Normal appearance.  HENT:     Mouth/Throat:     Mouth: Mucous membranes are moist.  Eyes:     Conjunctiva/sclera: Conjunctivae normal.  Cardiovascular:     Rate and Rhythm: Normal rate and regular rhythm.  Pulmonary:     Effort: Pulmonary effort is normal. No respiratory distress.     Breath sounds: Rales (trace basilar) present.  Abdominal:     General: Abdomen is flat.     Palpations: Abdomen is soft.     Tenderness: There is no abdominal tenderness.  Musculoskeletal:     Right lower leg: Edema present.     Left lower leg: Edema present.  Skin:    General: Skin is warm and dry.     Capillary Refill: Capillary refill takes less than 2 seconds.  Neurological:     General: No focal deficit present.     Mental Status: He is alert. Mental status is at baseline.     Comments: Oriented to self, knows he is in [place] at the hospital, but does not know the year or why he is here.  Moves all 4 extremities, follows commands  Psychiatric:        Mood and Affect: Mood normal.        Behavior: Behavior normal.     ED Results and Treatments Labs (all labs ordered are listed, but only abnormal results are displayed) Labs  Reviewed  RESP PANEL BY  RT-PCR (RSV, FLU A&B, COVID)  RVPGX2 - Abnormal; Notable for the following components:      Result Value   SARS Coronavirus 2 by RT PCR POSITIVE (*)    All other components within normal limits  PRO BRAIN NATRIURETIC PEPTIDE - Abnormal; Notable for the following components:   Pro Brain Natriuretic Peptide 829.0 (*)    All other components within normal limits  URINALYSIS, W/ REFLEX TO CULTURE (INFECTION SUSPECTED) - Abnormal; Notable for the following components:   APPearance CLOUDY (*)    Hgb urine dipstick LARGE (*)    Protein, ur 100 (*)    Leukocytes,Ua MODERATE (*)    Bacteria, UA FEW (*)    All other components within normal limits  URINE CULTURE  COMPREHENSIVE METABOLIC PANEL WITH GFR  CBC WITH DIFFERENTIAL/PLATELET  LACTIC ACID, PLASMA  LACTIC ACID, PLASMA                                                                                                                          Radiology DG Chest Portable 1 View Result Date: 12/07/2024 CLINICAL DATA:  Cough, confusion EXAM: PORTABLE CHEST 1 VIEW COMPARISON:  07/12/2024 FINDINGS: Two frontal views of the chest demonstrates stable enlargement of the cardiac silhouette. Chronic background scarring again noted, without acute airspace disease, effusion, or pneumothorax. No acute bony abnormalities. IMPRESSION: 1. Stable chest, no acute process. Electronically Signed   By: Ozell Daring M.D.   On: 12/07/2024 17:45    Pertinent labs & imaging results that were available during my care of the patient were reviewed by me and considered in my medical decision making (see MDM for details).  Medications Ordered in ED Medications  ciprofloxacin  (CIPRO ) IVPB 400 mg (400 mg Intravenous New Bag/Given 12/07/24 1942)                                                                                                                                     Procedures Procedures  (including critical care time)  Medical Decision Making / ED  Course   MDM:  89 year old presenting to the emergency department with weakness, mild confusion.  Patient overall well-appearing, mildly confused but following commands without any focal deficit.  Given history, seems UTI is likely.  Will check urinalysis.  He appears euvolemic to slightly volume up so we will hold off on any IV fluids.  He has had some cough which may be due to pneumonia, viral URI, or CHF so we will check a chest x-ray and BNP, flu and COVID swab.  Low concern for any acute intracranial process, he reports this is consistent with prior episodes of UTI, he is alert and conversational although somewhat confused.  Family denies any head injury.  Will reassess.  Clinical Course as of 12/07/24 1948  Tue Dec 07, 2024  1947 Workup shows evidence of UTI.  Based on prior culture data we will treat with Cipro .  Urine culture sent.  Also has COVID.  Discussed with patient and family who still feel comfortable with discharge.  Did offer hospitalization given age and increased weakness but they would prefer to treat them at home.  Discussed remdesivir with family would prefer to try other remedies and regular home care. Patient is stable for discharge. Will discharge to home.  [WS]    Clinical Course User Index [WS] Francesca Elsie CROME, MD     Additional history obtained: -Additional history obtained from family -External records from outside source obtained and reviewed including: Chart review including previous notes, labs, imaging, consultation notes including prior notes    Lab Tests: -I ordered, reviewed, and interpreted labs.   The pertinent results include:   Labs Reviewed  RESP PANEL BY RT-PCR (RSV, FLU A&B, COVID)  RVPGX2 - Abnormal; Notable for the following components:      Result Value   SARS Coronavirus 2 by RT PCR POSITIVE (*)    All other components within normal limits  PRO BRAIN NATRIURETIC PEPTIDE - Abnormal; Notable for the following components:   Pro Brain  Natriuretic Peptide 829.0 (*)    All other components within normal limits  URINALYSIS, W/ REFLEX TO CULTURE (INFECTION SUSPECTED) - Abnormal; Notable for the following components:   APPearance CLOUDY (*)    Hgb urine dipstick LARGE (*)    Protein, ur 100 (*)    Leukocytes,Ua MODERATE (*)    Bacteria, UA FEW (*)    All other components within normal limits  URINE CULTURE  COMPREHENSIVE METABOLIC PANEL WITH GFR  CBC WITH DIFFERENTIAL/PLATELET  LACTIC ACID, PLASMA  LACTIC ACID, PLASMA    Notable for + covid, mild BNP elevation with clear CXR, + UTI   EKG   EKG Interpretation Date/Time:  Tuesday December 07 2024 16:58:50 EST Ventricular Rate:  60 PR Interval:    QRS Duration:  173 QT Interval:  476 QTC Calculation: 476 R Axis:   178  Text Interpretation: Normal sinus rhythm Nonspecific intraventricular conduction delay Nonspecific ST abnormality Compared to previous tracing Normal sinus rhythm has replaced Atrial fibrillation Confirmed by Francesca Elsie ([id number]) on 12/07/2024 5:06:06 PM         Imaging Studies ordered: I ordered imaging studies including CXR On my interpretation imaging demonstrates no acute process I independently visualized and interpreted imaging. I agree with the radiologist interpretation   Medicines ordered and prescription drug management: Meds ordered this encounter  Medications   ciprofloxacin  (CIPRO ) IVPB 400 mg   ciprofloxacin  (CIPRO ) 500 MG tablet    Sig: Take 1 tablet (500 mg total) by mouth every 12 (twelve) hours for 7 days.    Dispense:  14 tablet    Refill:  0    -I have reviewed the patients home medicines and have made adjustments as needed   Reevaluation: After the interventions noted above, I reevaluated the patient and found that their symptoms have improved  Co morbidities that complicate  the patient evaluation  Past Medical History:  Diagnosis Date   Acid reflux    Atrial fibrillation (HCC)    Hand deformity,  congenital    right hand -fingers   High cholesterol    Hypertension       Dispostion: Disposition decision including need for hospitalization was considered, and patient discharged from emergency department.    Final Clinical Impression(s) / ED Diagnoses Final diagnoses:  Acute cystitis with hematuria  COVID-19     This chart was dictated using voice recognition software.  Despite best efforts to proofread,  errors can occur which can change the documentation meaning.     [1]  Social History Tobacco Use   Smoking status: Former  Building Services Engineer status: Never Used  Substance Use Topics   Alcohol use: Yes    Comment: 2-3 nights aweek   Drug use: Never     Francesca Elsie CROME, MD 12/07/24 1948  "

## 2024-12-07 NOTE — ED Notes (Signed)
 Pt presents with condom cath in place during Triage and family present report increased confusion and Pt is wearing 3L Nasal Cannula at baseline.

## 2024-12-07 NOTE — Discharge Instructions (Signed)
 We evaluated you for your cough and urinary symptoms.  Your testing shows that you have a UTI.  We have prescribed you an antibiotic.  Please take this as prescribed.  You also had COVID.  We sent a urine culture and we will call you if you need a different antibiotic.  For new or worsening symptoms such as worsening confusion, fevers or chills, fatigue, weakness, vomiting, or any other new symptoms, please return to the emergency department for reassessment.

## 2024-12-10 LAB — URINE CULTURE: Culture: >=100000 — AB

## 2024-12-11 ENCOUNTER — Telehealth (HOSPITAL_BASED_OUTPATIENT_CLINIC_OR_DEPARTMENT_OTHER): Payer: Self-pay | Admitting: *Deleted

## 2024-12-11 NOTE — Progress Notes (Signed)
 ED Antimicrobial Stewardship Positive Culture Follow Up   Anthony Fields is an 89 y.o. male who presented to [facility] on @ADMITDT @ with a chief complaint of  Chief Complaint  Patient presents with   Recurrent UTI    Recent Results (from the past 720 hours)  Resp panel by RT-PCR (RSV, Flu A&B, Covid) Urine, Clean Catch     Status: Abnormal   Collection Time: 12/07/24  5:16 PM   Specimen: Urine, Clean Catch; Nasal Swab  Result Value Ref Range Status   SARS Coronavirus 2 by RT PCR POSITIVE (A) NEGATIVE Final    Comment: (NOTE) SARS-CoV-2 target nucleic acids are DETECTED.  The SARS-CoV-2 RNA is generally detectable in upper respiratory specimens during the acute phase of infection. Positive results are indicative of the presence of the identified virus, but do not rule out bacterial infection or co-infection with other pathogens not detected by the test. Clinical correlation with patient history and other diagnostic information is necessary to determine patient infection status. The expected result is Negative.  Fact Sheet for Patients: bloggercourse.com  Fact Sheet for Healthcare Providers: seriousbroker.it  This test is not yet approved or cleared by the [place]  FDA and  has been authorized for detection and/or diagnosis of SARS-CoV-2 by FDA under an Emergency Use Authorization (EUA).  This EUA will remain in effect (meaning this test can be used) for the duration of  the COVID-19 declaration under Section 564(b)(1) of the A ct, 21 U.S.C. section 360bbb-3(b)(1), unless the authorization is terminated or revoked sooner.     Influenza A by PCR NEGATIVE NEGATIVE Final   Influenza B by PCR NEGATIVE NEGATIVE Final    Comment: (NOTE) The Xpert Xpress SARS-CoV-2/FLU/RSV plus assay is intended as an aid in the diagnosis of influenza from Nasopharyngeal swab specimens and should not be used as a sole basis for treatment. Nasal  washings and aspirates are unacceptable for Xpert Xpress SARS-CoV-2/FLU/RSV testing.  Fact Sheet for Patients: bloggercourse.com  Fact Sheet for Healthcare Providers: seriousbroker.it  This test is not yet approved or cleared by the [place]  FDA and has been authorized for detection and/or diagnosis of SARS-CoV-2 by FDA under an Emergency Use Authorization (EUA). This EUA will remain in effect (meaning this test can be used) for the duration of the COVID-19 declaration under Section 564(b)(1) of the Act, 21 U.S.C. section 360bbb-3(b)(1), unless the authorization is terminated or revoked.     Resp Syncytial Virus by PCR NEGATIVE NEGATIVE Final    Comment: (NOTE) Fact Sheet for Patients: bloggercourse.com  Fact Sheet for Healthcare Providers: seriousbroker.it  This test is not yet approved or cleared by the [place]  FDA and has been authorized for detection and/or diagnosis of SARS-CoV-2 by FDA under an Emergency Use Authorization (EUA). This EUA will remain in effect (meaning this test can be used) for the duration of the COVID-19 declaration under Section 564(b)(1) of the Act, 21 U.S.C. section 360bbb-3(b)(1), unless the authorization is terminated or revoked.  Performed at [facility], [address], [place], [place] [address]   Urine Culture     Status: Abnormal   Collection Time: 12/07/24  5:16 PM   Specimen: Urine, Random  Result Value Ref Range Status   Specimen Description   Final    URINE, RANDOM Performed at [facility], [address] [place], [place] [address]    Special Requests   Final    NONE Reflexed from [id number] Performed at [facility], [address], [place], [place]  [address]    Culture >=100,000 COLONIES/mL ESCHERICHIA COLI (A)  Final   Report Status 12/10/2024 FINAL  Final   Organism ID, Bacteria ESCHERICHIA COLI (A)  Final       Susceptibility   Escherichia coli - MIC*    AMPICILLIN >=32 RESISTANT Resistant     CEFAZOLIN  (URINE) Value in next row Resistant      >=32 RESISTANTThis is a modified FDA-approved test that has been validated and its performance characteristics determined by the reporting laboratory.  This laboratory is certified under the Clinical Laboratory Improvement Amendments CLIA as qualified to perform high complexity clinical laboratory testing.    CEFEPIME  Value in next row Sensitive      >=32 RESISTANTThis is a modified FDA-approved test that has been validated and its performance characteristics determined by the reporting laboratory.  This laboratory is certified under the Clinical Laboratory Improvement Amendments CLIA as qualified to perform high complexity clinical laboratory testing.    ERTAPENEM Value in next row Sensitive      >=32 RESISTANTThis is a modified FDA-approved test that has been validated and its performance characteristics determined by the reporting laboratory.  This laboratory is certified under the Clinical Laboratory Improvement Amendments CLIA as qualified to perform high complexity clinical laboratory testing.    CEFTRIAXONE  Value in next row Sensitive      >=32 RESISTANTThis is a modified FDA-approved test that has been validated and its performance characteristics determined by the reporting laboratory.  This laboratory is certified under the Clinical Laboratory Improvement Amendments CLIA as qualified to perform high complexity clinical laboratory testing.    CIPROFLOXACIN  Value in next row Intermediate      >=32 RESISTANTThis is a modified FDA-approved test that has been validated and its performance characteristics determined by the reporting laboratory.  This laboratory is certified under the Clinical Laboratory Improvement Amendments CLIA as qualified to perform high complexity clinical laboratory testing.    GENTAMICIN Value in next row Sensitive      >=32 RESISTANTThis is  a modified FDA-approved test that has been validated and its performance characteristics determined by the reporting laboratory.  This laboratory is certified under the Clinical Laboratory Improvement Amendments CLIA as qualified to perform high complexity clinical laboratory testing.    NITROFURANTOIN Value in next row Sensitive      >=32 RESISTANTThis is a modified FDA-approved test that has been validated and its performance characteristics determined by the reporting laboratory.  This laboratory is certified under the Clinical Laboratory Improvement Amendments CLIA as qualified to perform high complexity clinical laboratory testing.    TRIMETH/SULFA Value in next row Resistant      >=32 RESISTANTThis is a modified FDA-approved test that has been validated and its performance characteristics determined by the reporting laboratory.  This laboratory is certified under the Clinical Laboratory Improvement Amendments CLIA as qualified to perform high complexity clinical laboratory testing.    AMPICILLIN/SULBACTAM Value in next row Intermediate      >=32 RESISTANTThis is a modified FDA-approved test that has been validated and its performance characteristics determined by the reporting laboratory.  This laboratory is certified under the Clinical Laboratory Improvement Amendments CLIA as qualified to perform high complexity clinical laboratory testing.    PIP/TAZO Value in next row Sensitive      <=4 SENSITIVEThis is a modified FDA-approved test that has been validated and its performance characteristics determined by the reporting laboratory.  This laboratory is certified under the Clinical Laboratory Improvement Amendments CLIA as qualified to perform high complexity  clinical laboratory testing.    MEROPENEM Value in next row Sensitive      <=4 SENSITIVEThis is a modified FDA-approved test that has been validated and its performance characteristics determined by the reporting laboratory.  This laboratory is  certified under the Clinical Laboratory Improvement Amendments CLIA as qualified to perform high complexity clinical laboratory testing.    * >=100,000 COLONIES/mL ESCHERICHIA COLI    [x]  Treated with ciprofloxacin , organism resistant to prescribed antimicrobial  STOP ciprofloxacin  New antibiotic prescription: cefpodoxime  200 mg BID x 7 days (Qty 14; Refills 0)  ED Provider: Charlyn Dorn Buttner, PharmD, BCPS 12/11/2024 12:02 PM ED Clinical Pharmacist -  [phone]

## 2024-12-11 NOTE — Telephone Encounter (Signed)
 Post ED Visit - Positive Culture Follow-up: Unsuccessful Patient Follow-up  Culture assessed and recommendations reviewed by:  [x]  Dorn Buttner, Pharm.D. []  Venetia Gully, Pharm.D., BCPS AQ-ID []  Garrel Crews, Pharm.D., BCPS []  Almarie Lunger, Pharm.D., BCPS []  [place], [address].D., BCPS, AAHIVP []  Rosaline Bihari, Pharm.D., BCPS, AAHIVP []  Massie Rigg, PharmD []  Jodie Rower, PharmD, BCPS  Positive urine culture  []  Patient discharged without antimicrobial prescription and treatment is now indicated [x]  Organism is resistant to prescribed ED discharge antimicrobial []  Patient with positive blood cultures  Plan: Stop Cipro  Start Cefpodoxime  200mg  BID x 7 days per Dr. Charlyn  Unable to contact patient , letter will be sent to address on file  Anthony Fields 12/11/2024, 3:25 PM
